# Patient Record
Sex: Female | Born: 1940 | Race: White | Hispanic: No | Smoking: Current every day smoker
Health system: Southern US, Community
[De-identification: ages and names within clinical notes are randomized; demographics above are authoritative.]

## PROBLEM LIST (undated history)

## (undated) DIAGNOSIS — M1990 Unspecified osteoarthritis, unspecified site: Secondary | ICD-10-CM

## (undated) DIAGNOSIS — E079 Disorder of thyroid, unspecified: Secondary | ICD-10-CM

## (undated) DIAGNOSIS — K219 Gastro-esophageal reflux disease without esophagitis: Secondary | ICD-10-CM

## (undated) DIAGNOSIS — F419 Anxiety disorder, unspecified: Secondary | ICD-10-CM

## (undated) DIAGNOSIS — R569 Unspecified convulsions: Secondary | ICD-10-CM

## (undated) DIAGNOSIS — I10 Essential (primary) hypertension: Secondary | ICD-10-CM

---

## 2002-11-25 ENCOUNTER — Emergency Department (HOSPITAL_COMMUNITY): Admission: EM | Admit: 2002-11-25 | Discharge: 2002-11-25 | Payer: Self-pay | Admitting: Internal Medicine

## 2010-03-25 ENCOUNTER — Emergency Department (HOSPITAL_COMMUNITY)
Admission: EM | Admit: 2010-03-25 | Discharge: 2010-03-25 | Disposition: A | Discharge status: Home / Self Care | Payer: Medicare Other | Attending: Emergency Medicine | Admitting: Emergency Medicine

## 2010-03-25 DIAGNOSIS — G8929 Other chronic pain: Secondary | ICD-10-CM | POA: Insufficient documentation

## 2010-03-25 DIAGNOSIS — M069 Rheumatoid arthritis, unspecified: Secondary | ICD-10-CM | POA: Insufficient documentation

## 2010-03-25 DIAGNOSIS — IMO0002 Reserved for concepts with insufficient information to code with codable children: Secondary | ICD-10-CM | POA: Insufficient documentation

## 2010-03-25 DIAGNOSIS — M545 Low back pain, unspecified: Secondary | ICD-10-CM | POA: Insufficient documentation

## 2010-03-25 DIAGNOSIS — F341 Dysthymic disorder: Secondary | ICD-10-CM | POA: Insufficient documentation

## 2010-03-25 DIAGNOSIS — Z79899 Other long term (current) drug therapy: Secondary | ICD-10-CM | POA: Insufficient documentation

## 2010-03-25 DIAGNOSIS — M79609 Pain in unspecified limb: Secondary | ICD-10-CM | POA: Insufficient documentation

## 2011-01-22 ENCOUNTER — Emergency Department: Payer: Self-pay | Admitting: Emergency Medicine

## 2011-05-21 ENCOUNTER — Inpatient Hospital Stay (HOSPITAL_COMMUNITY)
Admission: EM | Admit: 2011-05-21 | Discharge: 2011-05-29 | DRG: 947 | Disposition: A | Discharge status: Skilled Nursing Facility | Payer: Medicare Other | Source: Ambulatory Visit | Attending: Family Medicine | Admitting: Family Medicine

## 2011-05-21 ENCOUNTER — Emergency Department (HOSPITAL_COMMUNITY): Payer: Medicare Other

## 2011-05-21 ENCOUNTER — Encounter (HOSPITAL_COMMUNITY): Payer: Self-pay | Admitting: *Deleted

## 2011-05-21 DIAGNOSIS — F411 Generalized anxiety disorder: Secondary | ICD-10-CM | POA: Diagnosis present

## 2011-05-21 DIAGNOSIS — E785 Hyperlipidemia, unspecified: Secondary | ICD-10-CM | POA: Diagnosis present

## 2011-05-21 DIAGNOSIS — M549 Dorsalgia, unspecified: Secondary | ICD-10-CM | POA: Diagnosis present

## 2011-05-21 DIAGNOSIS — G40109 Localization-related (focal) (partial) symptomatic epilepsy and epileptic syndromes with simple partial seizures, not intractable, without status epilepticus: Secondary | ICD-10-CM | POA: Diagnosis present

## 2011-05-21 DIAGNOSIS — G929 Unspecified toxic encephalopathy: Secondary | ICD-10-CM | POA: Diagnosis present

## 2011-05-21 DIAGNOSIS — I10 Essential (primary) hypertension: Secondary | ICD-10-CM | POA: Diagnosis present

## 2011-05-21 DIAGNOSIS — E871 Hypo-osmolality and hyponatremia: Secondary | ICD-10-CM | POA: Diagnosis present

## 2011-05-21 DIAGNOSIS — E876 Hypokalemia: Secondary | ICD-10-CM

## 2011-05-21 DIAGNOSIS — F17200 Nicotine dependence, unspecified, uncomplicated: Secondary | ICD-10-CM | POA: Diagnosis present

## 2011-05-21 DIAGNOSIS — R5381 Other malaise: Secondary | ICD-10-CM | POA: Diagnosis not present

## 2011-05-21 DIAGNOSIS — F429 Obsessive-compulsive disorder, unspecified: Secondary | ICD-10-CM

## 2011-05-21 DIAGNOSIS — M810 Age-related osteoporosis without current pathological fracture: Secondary | ICD-10-CM | POA: Diagnosis present

## 2011-05-21 DIAGNOSIS — D72829 Elevated white blood cell count, unspecified: Secondary | ICD-10-CM | POA: Diagnosis present

## 2011-05-21 DIAGNOSIS — R569 Unspecified convulsions: Secondary | ICD-10-CM

## 2011-05-21 DIAGNOSIS — D649 Anemia, unspecified: Secondary | ICD-10-CM | POA: Diagnosis present

## 2011-05-21 DIAGNOSIS — G8929 Other chronic pain: Secondary | ICD-10-CM | POA: Diagnosis present

## 2011-05-21 DIAGNOSIS — K219 Gastro-esophageal reflux disease without esophagitis: Secondary | ICD-10-CM | POA: Diagnosis present

## 2011-05-21 DIAGNOSIS — G92 Toxic encephalopathy: Secondary | ICD-10-CM | POA: Diagnosis present

## 2011-05-21 DIAGNOSIS — M129 Arthropathy, unspecified: Secondary | ICD-10-CM | POA: Diagnosis present

## 2011-05-21 DIAGNOSIS — E039 Hypothyroidism, unspecified: Secondary | ICD-10-CM

## 2011-05-21 DIAGNOSIS — R4182 Altered mental status, unspecified: Principal | ICD-10-CM

## 2011-05-21 DIAGNOSIS — Z79899 Other long term (current) drug therapy: Secondary | ICD-10-CM

## 2011-05-21 HISTORY — DX: Disorder of thyroid, unspecified: E07.9

## 2011-05-21 HISTORY — DX: Essential (primary) hypertension: I10

## 2011-05-21 HISTORY — DX: Gastro-esophageal reflux disease without esophagitis: K21.9

## 2011-05-21 HISTORY — DX: Anxiety disorder, unspecified: F41.9

## 2011-05-21 HISTORY — DX: Unspecified osteoarthritis, unspecified site: M19.90

## 2011-05-21 HISTORY — DX: Unspecified convulsions: R56.9

## 2011-05-21 LAB — CBC
HCT: 32.5 % — ABNORMAL LOW (ref 36.0–46.0)
Hemoglobin: 11.1 g/dL — ABNORMAL LOW (ref 12.0–15.0)
MCH: 31.7 pg (ref 26.0–34.0)
RBC: 3.5 MIL/uL — ABNORMAL LOW (ref 3.87–5.11)

## 2011-05-21 LAB — DIFFERENTIAL
Eosinophils Absolute: 0.1 10*3/uL (ref 0.0–0.7)
Lymphs Abs: 2.3 10*3/uL (ref 0.7–4.0)
Monocytes Absolute: 0.7 10*3/uL (ref 0.1–1.0)
Monocytes Relative: 6 % (ref 3–12)
Neutro Abs: 8.7 10*3/uL — ABNORMAL HIGH (ref 1.7–7.7)
Neutrophils Relative %: 73 % (ref 43–77)

## 2011-05-21 LAB — COMPREHENSIVE METABOLIC PANEL
Alkaline Phosphatase: 118 U/L — ABNORMAL HIGH (ref 39–117)
BUN: 12 mg/dL (ref 6–23)
Chloride: 91 mEq/L — ABNORMAL LOW (ref 96–112)
GFR calc Af Amer: >90 mL/min (ref >90)
Glucose, Bld: 107 mg/dL — ABNORMAL HIGH (ref 70–99)
Potassium: 2.9 mEq/L — ABNORMAL LOW (ref 3.5–5.1)
Total Bilirubin: 0.2 mg/dL — ABNORMAL LOW (ref 0.3–1.2)

## 2011-05-21 MED ORDER — POTASSIUM CHLORIDE CRYS ER 20 MEQ PO TBCR
40.0000 meq | EXTENDED_RELEASE_TABLET | Freq: Once | ORAL | Status: AC
  Administered 2011-05-21: 40 meq via ORAL
  Filled 2011-05-21: qty 2

## 2011-05-21 MED ORDER — SODIUM CHLORIDE 0.9 % IV BOLUS (SEPSIS)
500.0000 mL | Freq: Once | INTRAVENOUS | Status: AC
  Administered 2011-05-21: 1000 mL via INTRAVENOUS

## 2011-05-21 MED ORDER — SODIUM CHLORIDE 0.9 % IV SOLN
Freq: Once | INTRAVENOUS | Status: AC
  Administered 2011-05-22: 04:00:00 via INTRAVENOUS

## 2011-05-21 NOTE — ED Notes (Signed)
Pt to ED with her sister who reports pt has been confused for past 3 days.  St's pt has been repetitive  And also has noticed other abnormal behavior.

## 2011-05-21 NOTE — ED Notes (Signed)
Th pt has had some confusion and disorientation since yesterday.  She has this intermtitently.  Worse today

## 2011-05-21 NOTE — ED Notes (Signed)
The pt cannot void at present 

## 2011-05-21 NOTE — ED Provider Notes (Signed)
History     CSN: [id number]  Arrival date & time 05/21/11  0272   First MD Initiated Contact with Patient 05/21/11 2141      Chief Complaint  Patient presents with  . confusion     Patient is a 71 y.o. female presenting with altered mental status. The history is provided by the patient and a relative. No language interpreter was used.  Altered Mental Status This is a new problem. The current episode started in the past 7 days. The problem occurs intermittently. The problem has been unchanged. Pertinent negatives include no abdominal pain, anorexia, change in bowel habit, chest pain, chills, congestion, coughing, diaphoresis, fever, headaches, joint swelling, nausea, neck pain, numbness, rash, sore throat, vertigo, visual change, vomiting or weakness. The symptoms are aggravated by nothing. She has tried nothing for the symptoms. The treatment provided no relief.    Past Medical History  Diagnosis Date  . Hypertension   . Thyroid disease   . Seizures   . Arthritis   . GERD (gastroesophageal reflux disease)   . Anxiety     History reviewed. No pertinent past surgical history.  No family history on file.  History  Substance Use Topics  . Smoking status: Current Everyday Smoker  . Smokeless tobacco: Not on file  . Alcohol Use: No    OB History    Grav Para Term Preterm Abortions TAB SAB Ect Mult Living                  Review of Systems  Constitutional: Negative for fever, chills and diaphoresis.  HENT: Negative for congestion, sore throat, trouble swallowing, neck pain and neck stiffness.   Eyes: Negative for photophobia, pain, discharge, redness, itching and visual disturbance.  Respiratory: Negative for cough, chest tightness, shortness of breath, wheezing and stridor.   Cardiovascular: Negative for chest pain, palpitations and leg swelling.  Gastrointestinal: Negative for nausea, vomiting, abdominal pain, diarrhea, constipation, blood in stool, anorexia and change  in bowel habit.  Genitourinary: Negative for dysuria, urgency, frequency, hematuria, flank pain, decreased urine volume, difficulty urinating and pelvic pain.  Musculoskeletal: Negative for back pain, joint swelling and gait problem.  Skin: Negative for rash and wound.  Neurological: Negative for dizziness, vertigo, tremors, seizures, syncope, facial asymmetry, speech difficulty, weakness, light-headedness, numbness and headaches.  Hematological: Negative for adenopathy. Does not bruise/bleed easily.  Psychiatric/Behavioral: Positive for confusion and altered mental status. Negative for decreased concentration.    Allergies  Review of patient's allergies indicates no known allergies.  Home Medications   Current Outpatient Rx  Name Route Sig Dispense Refill  . ALPRAZOLAM 1 MG PO TABS Oral Take 0.5-1 mg by mouth 3 (three) times daily as needed. For anxiety    . HEMATINIC PLUS VIT/MINERALS PO Oral Take 1 tablet by mouth daily.    Marland Kitchen DIVALPROEX SODIUM 500 MG PO TBEC Oral Take 500 mg by mouth daily.    Marland Kitchen HYDROCHLOROTHIAZIDE 12.5 MG PO CAPS Oral Take 12.5 mg by mouth daily.    Marland Kitchen HYDROXYCHLOROQUINE SULFATE 200 MG PO TABS Oral Take 400 mg by mouth daily.    Marland Kitchen LEVOTHYROXINE SODIUM 25 MCG PO TABS Oral Take 25 mcg by mouth daily.    Marland Kitchen METOPROLOL TARTRATE 50 MG PO TABS Oral Take 50 mg by mouth daily.    Marland Kitchen MIRTAZAPINE 15 MG PO TABS Oral Take 15 mg by mouth at bedtime.    . OXYCODONE-ACETAMINOPHEN 5-325 MG PO TABS Oral Take 1 tablet by mouth every 6 (  six) hours as needed. For pain    . SERTRALINE HCL 50 MG PO TABS Oral Take 50 mg by mouth daily.    Marland Kitchen VITAMIN B-12 500 MCG PO TABS Oral Take 500 mcg by mouth daily.      BP 161/73  Pulse 76  Temp(Src) 97 F (36.1 C) (Rectal)  Resp 18  SpO2 97%  Physical Exam  Constitutional: She is oriented to person, place, and time. She appears well-developed and well-nourished. No distress.  HENT:  Head: Normocephalic and atraumatic.  Mouth/Throat: No  oropharyngeal exudate.  Eyes: Conjunctivae and EOM are normal. Pupils are equal, round, and reactive to light. Right eye exhibits no discharge. Left eye exhibits no discharge. No scleral icterus.  Neck: Normal range of motion. Neck supple. No JVD present.  Cardiovascular: Normal rate, regular rhythm, normal heart sounds and intact distal pulses.   No murmur heard. Pulmonary/Chest: Effort normal and breath sounds normal. No stridor. No respiratory distress. She has no wheezes. She has no rales. She exhibits no tenderness.  Abdominal: Soft. Bowel sounds are normal. She exhibits no distension and no mass. There is no tenderness. There is no rebound and no guarding.  Musculoskeletal: Normal range of motion. She exhibits no edema and no tenderness.  Neurological: She is alert and oriented to person, place, and time. She has normal strength. Coordination normal. GCS eye subscore is 4. GCS verbal subscore is 5. GCS motor subscore is 6.       Pt intermittently followed commands with CN testing. Pt unable to state and sensory abnormalities.   Skin: Skin is warm and dry. She is not diaphoretic.  Psychiatric: She has a normal mood and affect.    ED Course  Procedures (including critical care time)  Labs Reviewed  CBC - Abnormal; Notable for the following:    WBC 11.9 (*)    RBC 3.50 (*)    Hemoglobin 11.1 (*)    HCT 32.5 (*)    Platelets 528 (*)    All other components within normal limits  DIFFERENTIAL - Abnormal; Notable for the following:    Neutro Abs 8.7 (*)    All other components within normal limits  COMPREHENSIVE METABOLIC PANEL - Abnormal; Notable for the following:    Sodium 131 (*)    Potassium 2.9 (*)    Chloride 91 (*)    Glucose, Bld 107 (*)    Albumin 3.0 (*)    Alkaline Phosphatase 118 (*)    Total Bilirubin 0.2 (*)    GFR calc non Af Amer 86 (*)    All other components within normal limits  URINALYSIS, ROUTINE W REFLEX MICROSCOPIC  ETHANOL  AMMONIA  URINALYSIS, ROUTINE  W REFLEX MICROSCOPIC  URINE CULTURE  URINE RAPID DRUG SCREEN (HOSP PERFORMED)  VALPROIC ACID LEVEL  TSH  T4, FREE   Ct Head Wo Contrast  05/21/2011  *RADIOLOGY REPORT*  Clinical Data: Confusion and disorientation since yesterday. Symptoms are worse today.  CT HEAD WITHOUT CONTRAST  Technique:  Contiguous axial images were obtained from the base of the skull through the vertex without contrast.  Comparison: None  Findings:  There is mild central cortical atrophy.  Periventricular white matter changes are consistent with small vessel disease. There is no evidence for hemorrhage, mass lesion, or acute infarction.  Bone windows show atherosclerotic calcification of the internal carotid arteries and are otherwise unremarkable.  IMPRESSION: 1.  Mild atrophy and small vessel disease.  2. No evidence for acute intracranial abnormality. Marland Kitchen  Original Report Authenticated By: Patterson Hammersmith, M.D.     1. Altered mental status    MDM  Pt is a 71yo F with PMH of seizures brought by sister for abnormal behavior over the past week including walking outside without supervision into the street and repeatative behaviors like kissing the sister on the cheek over and over. Pt A&Ox3 in NAD with stable vital signs but intermittently cooperative on exam. Pt would have periods of complete lucency followed by periods of stuttering incomprehensible speech in room which sister states pt has been doing for months and has not increased in frequency. Pt admitted to [facility] last week for seizures. Records request completed but no documents faxed back. K+ repleted. Head CT negative. Labs largely unremarkable. Infectious etiology doubtful but UA pending. Rectal temp WNL. EKG shows no signs of ischemia. Loading dose depakote given IV. Of note pt was found to have sisters robaxin and flexeril in coat pocket with unknown number of pills gone. I anticipate this could be contributing to patients symptoms. Clinical picture  doubtful for AMS caused by ETOH, hypertensive or hepatic encephalopathy, hypoglycemia, opiate OD, uremia, trauma, tumor, thyroid storm (studies pending), infection, seizure or stroke. Possibly a psychiatric component. Admitted to hospitalist in stable condition.         Consuello Masse, MD 05/22/11 [phone]

## 2011-05-22 ENCOUNTER — Inpatient Hospital Stay (HOSPITAL_COMMUNITY): Payer: Medicare Other

## 2011-05-22 ENCOUNTER — Encounter (HOSPITAL_COMMUNITY): Payer: Self-pay | Admitting: Internal Medicine

## 2011-05-22 DIAGNOSIS — R569 Unspecified convulsions: Secondary | ICD-10-CM | POA: Diagnosis present

## 2011-05-22 DIAGNOSIS — R4182 Altered mental status, unspecified: Principal | ICD-10-CM | POA: Diagnosis present

## 2011-05-22 DIAGNOSIS — F411 Generalized anxiety disorder: Secondary | ICD-10-CM

## 2011-05-22 DIAGNOSIS — E876 Hypokalemia: Secondary | ICD-10-CM | POA: Diagnosis present

## 2011-05-22 DIAGNOSIS — F429 Obsessive-compulsive disorder, unspecified: Secondary | ICD-10-CM

## 2011-05-22 DIAGNOSIS — M7989 Other specified soft tissue disorders: Secondary | ICD-10-CM

## 2011-05-22 DIAGNOSIS — E039 Hypothyroidism, unspecified: Secondary | ICD-10-CM

## 2011-05-22 LAB — URINALYSIS, ROUTINE W REFLEX MICROSCOPIC
Glucose, UA: NEGATIVE mg/dL
Ketones, ur: NEGATIVE mg/dL
Protein, ur: NEGATIVE mg/dL

## 2011-05-22 LAB — CBC
HCT: 35.3 % — ABNORMAL LOW (ref 36.0–46.0)
MCHC: 33.4 g/dL (ref 30.0–36.0)
Platelets: 541 10*3/uL — ABNORMAL HIGH (ref 150–400)
RDW: 14 % (ref 11.5–15.5)
WBC: 10.1 10*3/uL (ref 4.0–10.5)

## 2011-05-22 LAB — BASIC METABOLIC PANEL
BUN: 6 mg/dL (ref 6–23)
Chloride: 96 mEq/L (ref 96–112)
GFR calc Af Amer: >90 mL/min (ref >90)
GFR calc non Af Amer: >90 mL/min (ref >90)
Potassium: 3.1 mEq/L — ABNORMAL LOW (ref 3.5–5.1)

## 2011-05-22 LAB — RAPID URINE DRUG SCREEN, HOSP PERFORMED
Barbiturates: NOT DETECTED
Cocaine: NOT DETECTED

## 2011-05-22 LAB — URINE MICROSCOPIC-ADD ON

## 2011-05-22 LAB — URINE CULTURE
Colony Count: NO GROWTH
Culture: NO GROWTH

## 2011-05-22 LAB — VALPROIC ACID LEVEL: Valproic Acid Lvl: 67.4 ug/mL (ref 50.0–100.0)

## 2011-05-22 LAB — RPR: RPR Ser Ql: NONREACTIVE

## 2011-05-22 LAB — TSH: TSH: 1.94 u[IU]/mL (ref 0.350–4.500)

## 2011-05-22 LAB — T4, FREE: Free T4: 1 ng/dL (ref 0.80–1.80)

## 2011-05-22 MED ORDER — HYDRALAZINE HCL 20 MG/ML IJ SOLN
10.0000 mg | Freq: Four times a day (QID) | INTRAMUSCULAR | Status: DC | PRN
  Filled 2011-05-22 (×2): qty 0.5

## 2011-05-22 MED ORDER — HYDROCHLOROTHIAZIDE 12.5 MG PO CAPS
12.5000 mg | ORAL_CAPSULE | Freq: Every day | ORAL | Status: DC
  Administered 2011-05-22 – 2011-05-23 (×2): 12.5 mg via ORAL
  Filled 2011-05-22 (×2): qty 1

## 2011-05-22 MED ORDER — POTASSIUM CHLORIDE 10 MEQ/100ML IV SOLN
10.0000 meq | INTRAVENOUS | Status: AC
  Administered 2011-05-22: 10 meq via INTRAVENOUS
  Filled 2011-05-22: qty 100

## 2011-05-22 MED ORDER — LORAZEPAM 2 MG/ML IJ SOLN
INTRAMUSCULAR | Status: AC
  Filled 2011-05-22: qty 1

## 2011-05-22 MED ORDER — MIRTAZAPINE 15 MG PO TABS
15.0000 mg | ORAL_TABLET | Freq: Every day | ORAL | Status: DC
  Filled 2011-05-22: qty 1

## 2011-05-22 MED ORDER — ACETAMINOPHEN 325 MG PO TABS
650.0000 mg | ORAL_TABLET | Freq: Four times a day (QID) | ORAL | Status: DC | PRN
  Administered 2011-05-25 (×2): 650 mg via ORAL
  Filled 2011-05-22 (×3): qty 2

## 2011-05-22 MED ORDER — HYDROXYCHLOROQUINE SULFATE 200 MG PO TABS
400.0000 mg | ORAL_TABLET | Freq: Every day | ORAL | Status: DC
  Administered 2011-05-22 – 2011-05-29 (×8): 400 mg via ORAL
  Filled 2011-05-22 (×9): qty 2

## 2011-05-22 MED ORDER — VALPROATE SODIUM 500 MG/5ML IV SOLN
500.0000 mg | Freq: Once | INTRAVENOUS | Status: DC
  Filled 2011-05-22: qty 5

## 2011-05-22 MED ORDER — OXYCODONE-ACETAMINOPHEN 5-325 MG PO TABS: 1.0000 | ORAL_TABLET | Freq: Four times a day (QID) | ORAL | Status: DC | PRN

## 2011-05-22 MED ORDER — ENOXAPARIN SODIUM 40 MG/0.4ML ~~LOC~~ SOLN
40.0000 mg | SUBCUTANEOUS | Status: DC
  Administered 2011-05-23 – 2011-05-28 (×7): 40 mg via SUBCUTANEOUS
  Filled 2011-05-22 (×9): qty 0.4

## 2011-05-22 MED ORDER — ONDANSETRON HCL 4 MG PO TABS: 4.0000 mg | ORAL_TABLET | Freq: Four times a day (QID) | ORAL | Status: DC | PRN

## 2011-05-22 MED ORDER — METOPROLOL TARTRATE 50 MG PO TABS
50.0000 mg | ORAL_TABLET | Freq: Every day | ORAL | Status: DC
  Administered 2011-05-22 – 2011-05-23 (×2): 50 mg via ORAL
  Filled 2011-05-22 (×2): qty 1

## 2011-05-22 MED ORDER — DIVALPROEX SODIUM 500 MG PO DR TAB
500.0000 mg | DELAYED_RELEASE_TABLET | Freq: Every day | ORAL | Status: DC
  Administered 2011-05-22: 500 mg via ORAL
  Filled 2011-05-22: qty 1

## 2011-05-22 MED ORDER — POTASSIUM CHLORIDE CRYS ER 20 MEQ PO TBCR
40.0000 meq | EXTENDED_RELEASE_TABLET | Freq: Once | ORAL | Status: AC
  Administered 2011-05-22: 40 meq via ORAL
  Filled 2011-05-22: qty 2

## 2011-05-22 MED ORDER — FLUVOXAMINE MALEATE 50 MG PO TABS
25.0000 mg | ORAL_TABLET | Freq: Two times a day (BID) | ORAL | Status: DC
  Administered 2011-05-22 – 2011-05-26 (×8): 25 mg via ORAL
  Filled 2011-05-22 (×10): qty 1

## 2011-05-22 MED ORDER — LEVOTHYROXINE SODIUM 25 MCG PO TABS
25.0000 ug | ORAL_TABLET | Freq: Every day | ORAL | Status: DC
  Administered 2011-05-22 – 2011-05-29 (×8): 25 ug via ORAL
  Filled 2011-05-22 (×12): qty 1

## 2011-05-22 MED ORDER — ONDANSETRON HCL 4 MG/2ML IJ SOLN: 4.0000 mg | Freq: Four times a day (QID) | INTRAMUSCULAR | Status: DC | PRN

## 2011-05-22 MED ORDER — SERTRALINE HCL 50 MG PO TABS
50.0000 mg | ORAL_TABLET | Freq: Every day | ORAL | Status: DC
  Administered 2011-05-22: 50 mg via ORAL
  Filled 2011-05-22: qty 1

## 2011-05-22 MED ORDER — VALPROATE SODIUM 500 MG/5ML IV SOLN
500.0000 mg | Freq: Once | INTRAVENOUS | Status: AC
  Administered 2011-05-22: 500 mg via INTRAVENOUS
  Filled 2011-05-22: qty 5

## 2011-05-22 MED ORDER — OXYCODONE-ACETAMINOPHEN 5-325 MG PO TABS
1.0000 | ORAL_TABLET | Freq: Four times a day (QID) | ORAL | Status: DC | PRN
  Administered 2011-05-23 – 2011-05-29 (×6): 1 via ORAL
  Filled 2011-05-22 (×7): qty 1

## 2011-05-22 MED ORDER — CYANOCOBALAMIN 250 MCG PO TABS
500.0000 ug | ORAL_TABLET | Freq: Every day | ORAL | Status: DC
  Administered 2011-05-22 – 2011-05-26 (×5): 500 ug via ORAL
  Administered 2011-05-27: 11:00:00 via ORAL
  Administered 2011-05-28 – 2011-05-29 (×2): 500 ug via ORAL
  Filled 2011-05-22 (×9): qty 2

## 2011-05-22 MED ORDER — MIRTAZAPINE 15 MG PO TABS
15.0000 mg | ORAL_TABLET | Freq: Every day | ORAL | Status: DC
  Administered 2011-05-23 – 2011-05-28 (×6): 15 mg via ORAL
  Filled 2011-05-22 (×8): qty 1

## 2011-05-22 MED ORDER — ALPRAZOLAM 0.5 MG PO TABS: 0.5000 mg | ORAL_TABLET | Freq: Three times a day (TID) | ORAL | Status: DC | PRN

## 2011-05-22 MED ORDER — LORAZEPAM 2 MG/ML IJ SOLN: 1.0000 mg | INTRAMUSCULAR | Status: DC | PRN

## 2011-05-22 MED ORDER — DIVALPROEX SODIUM 500 MG PO DR TAB
750.0000 mg | DELAYED_RELEASE_TABLET | Freq: Every day | ORAL | Status: DC
  Administered 2011-05-23 – 2011-05-29 (×7): 750 mg via ORAL
  Filled 2011-05-22 (×7): qty 1

## 2011-05-22 MED ORDER — LORAZEPAM 2 MG/ML IJ SOLN
1.0000 mg | INTRAMUSCULAR | Status: AC
  Administered 2011-05-22: 1 mg via INTRAVENOUS

## 2011-05-22 NOTE — ED Provider Notes (Addendum)
  I performed a history and physical examination of Amber Pierce and discussed her management with Dr. March Rummage.  I agree with the history, physical, assessment, and plan of care, with the following exceptions: None  New onset altered metal status. This occurred in the past few days. She lives at home with her sister. She has been performing numerous bizarre behaviors such as repetitive folding of a blanket and chasing her sister around the house using her. She has a history of partial complex seizures. Her medications were minister by her sister. Has had similar episodes attributed to seizures in the past. Patient is confused on examination. She mumbles. Heart is regular rate and rhythm. Lungs clear auscultation bilaterally. CT is negative. She is hypokalemic. She'll require further evaluation as an inpatient   Date: 05/22/2011  Rate: 77  Rhythm: normal sinus rhythm and premature atrial contractions (PAC)  QRS Axis: normal  Intervals: normal  ST/T Wave abnormalities: normal  Conduction Disutrbances:none  Narrative Interpretation:   Old EKG Reviewed: none available  Tildon Husky, MD 05/22/11 0037  Dayton Bailiff, MD 05/22/11 [phone]

## 2011-05-22 NOTE — ED Notes (Signed)
Pt resting at this time.  C/o back pain and headache.

## 2011-05-22 NOTE — Progress Notes (Signed)
Overnight event:  RN called this NP 2/2 pt seizing. NP immediately to bedside.  S: Family says pt has had "seizures" all day. RN says last seizure was at 1500. Pt can not participate in ROS 2/2 mental status. RN says pt had tremors of head and upper extremities with urinary incontinence and biting of tongue.  O: Afebrile. SBP up to 220 but came down quickly to 162 without meds.  RR 20 and unlabored. Her O2 sat dropped slightly with seizure on 2L  so O2 rate up to 4L with normal SaO2 after that. Pt is unresponsive at present to name calling and tactile stimulation. PERRL at 7mm. Cardiac exam S1S2 with tachycardia. Skin is clammy.  While in room evaluating pt, she had another seizure, followed by 2 more all resembling the first one.  A/P: 1. Seizure disorder-when pt came in her valproic acid level was slightly low, so dose of IV Depakote given and Depakote ER started. Her sister says she has not missed a dose of med for at least 7 days b/c the sister was administering her meds. This NP called the Neuro Hospitalist on call. Will get stat VA level and if low, give more IV Depakote. Also, adjust a.m. Dose of Depakote. Gave Ativan 1mg  after 2nd seizure started (we had to get IV access) and pt did not have another seizure after the 4th one. This NP reviewed chart from today. EEG was normal per Dr. Lyman Speller and a psych eval was also done.  Transfer pt to step down unit for closer monitoring. Will use Ativan as needed. Will call neuro back if pt conts to seize and MD said he would see pt tonight. Will discuss case with Dr. Conley Rolls.  Maren Reamer, NP [facility]

## 2011-05-22 NOTE — Progress Notes (Signed)
Utilization review completed.  

## 2011-05-22 NOTE — Progress Notes (Signed)
Patient ID: Marlan Palau, female   DOB: [birth year], 71 y.o.   MRN: [id number] PATIENT DETAILS Name: Amber Pierce Age: 71 y.o. Sex: female Date of Birth: May 29, 1940 Admit Date: 05/21/2011 PCP:No primary provider on file.   Subjective: Patient reports back pain.  Requesting pain medication.  Objective: Weight change:   Intake/Output Summary (Last 24 hours) at 05/22/11 1204 Last data filed at 05/22/11 0926  Gross per 24 hour  Intake    360 ml  Output   2950 ml  Net  -2590 ml   Blood pressure 183/77, pulse 70, temperature 98.5 F (36.9 C), temperature source Oral, resp. rate 18, height 5\' 1"  (1.549 m), weight 45.5 kg (100 lb 5 oz), SpO2 98.00%. Filed Vitals:   05/21/11 2321 05/22/11 0035 05/22/11 0101 05/22/11 0408  BP:  170/83 182/77 183/77  Pulse:  80 76 70  Temp: 97 F (36.1 C)  98.4 F (36.9 C) 98.5 F (36.9 C)  TempSrc: Rectal  Oral Oral  Resp:  18 18 18   Height:    5\' 1"  (1.549 m)  Weight:    45.5 kg (100 lb 5 oz)  SpO2:  99% 99% 98%    Physical Exam: General: No acute distress.  Alert and orientated. Lungs: Clear to auscultation bilaterally without wheezes or crackles Cardiovascular: Regular rate and rhythm without murmur gallop or rub normal S1 and S2 Abdomen: Nontender, nondistended, soft, bowel sounds positive, no rebound, no ascites, no appreciable mass Extremities: No significant cyanosis, clubbing, or edema bilateral lower extremities Psych:  Cooperative, breaks into tears with fear that her sister is tired of caring for her. Has some difficulty getting out phrases - but is always successful eventually.  Basic Metabolic Panel:  Lab 05/22/11 4696 05/21/11 1917  NA 133* 131*  K 3.1* 2.9*  CL 96 91*  CO2 25 25  GLUCOSE 87 107*  BUN 6 12  CREATININE 0.41* 0.70  CALCIUM 9.0 9.2  MG -- --  PHOS -- --   Liver Function Tests:  Lab 05/21/11 1917  AST 31  ALT 20  ALKPHOS 118*  BILITOT 0.2*  PROT 6.9  ALBUMIN 3.0*    Lab 05/21/11 2302    AMMONIA 41   CBC:  Lab 05/22/11 0627 05/21/11 1917  WBC 10.1 11.9*  NEUTROABS -- 8.7*  HGB 11.8* 11.1*  HCT 35.3* 32.5*  MCV 92.4 92.9  PLT 541* 528*   Thyroid Function Tests:  Lab 05/21/11 2301  TSH 1.571  T4TOTAL --  FREET4 1.00  T3FREE --  THYROIDAB --   Urine Drug Screen: Drugs of Abuse     Component Value Date/Time   LABOPIA NONE DETECTED 05/21/2011 2322   COCAINSCRNUR NONE DETECTED 05/21/2011 2322   LABBENZ NONE DETECTED 05/21/2011 2322   AMPHETMU NONE DETECTED 05/21/2011 2322   THCU NONE DETECTED 05/21/2011 2322   LABBARB NONE DETECTED 05/21/2011 2322    Alcohol Level:  Lab 05/21/11 2301  ETH <11    Studies/Results: Scheduled Meds:    . sodium chloride   Intravenous Once  . divalproex  500 mg Oral Daily  . enoxaparin  40 mg Subcutaneous Q24H  . hydroxychloroquine  400 mg Oral Daily  . levothyroxine  25 mcg Oral Q0600  . metoprolol  50 mg Oral Daily  . mirtazapine  15 mg Oral QHS  . potassium chloride  10 mEq Intravenous Q1 Hr x 2  . potassium chloride  40 mEq Oral Once  . potassium chloride  40 mEq Oral Once  .  sertraline  50 mg Oral Daily  . sodium chloride  500 mL Intravenous Once  . valproate sodium  500 mg Intravenous Once  . vitamin B-12  500 mcg Oral Daily  . DISCONTD: valproate  500 mg Intravenous Once   Continuous Infusions:  PRN Meds:.acetaminophen, ALPRAZolam, ondansetron (ZOFRAN) IV, ondansetron, oxyCODONE-acetaminophen  Anti-infectives:  Anti-infectives     Start     Dose/Rate Route Frequency Ordered Stop   05/22/11 1000   hydroxychloroquine (PLAQUENIL) tablet 400 mg        400 mg Oral Daily 05/22/11 0424            Assessment/Plan:  1.  Altered mental status -  Patient calm and at baseline today.  CT head negative. TSH wnl. B12 and RPR are pending.   Have requested psychiatry consult as the patient does not have any physical exam findings or test results that would explain change in behavior.   I believe this is an  exacerbation of her OCD/psychiatric diagnosis. cancelling MRI. Psychiatry has seen the patient and believe she has capacity to make her own decisions.  They recommend starting Luvox 25 mg BID and titrate up. Discontinue Zoloft.  Psychiatry states patient is safe to go home with her sister (primary care taker)  2.  Partial seizure - I see no evidence of seizure on exam or in history.  Depakote level is low - we are giving it.  EEG completed.  Results pending.  Patient reports her last seizure was over 1 year ago and she is compliant with her medications.  3.  Arthritis pain - back pain, stable.  Patient on plaquenil.  Takes percocet at home, will restart home dose.  4.  Hypokalemia - replete ing potassium.  Check bmet in am if still inpatient.   5.  Normocytic Anemia - will monitor, stable for outpatient work up.  6.  Hypertension - on home dose metoprolol, will add back HCTZ.  Add PRN Hydralazine.  DVT Prophylaxis - on lovenox.  Disposition - Psych Social work contacting sister to see if patient can return home.   LOS: 1 day   Stephani Police 05/22/2011, 12:04 PM [phone]  Attending -I have seen and examined this patient, I agree with the assessment and plan as outlined above.  Dr Windell Norfolk

## 2011-05-22 NOTE — ED Notes (Signed)
Contact information: H[facility] Bower [phone]                                    [name]  [phone]

## 2011-05-22 NOTE — ED Notes (Signed)
Patient pulled out IV. Tip intact. Dressing applied to site. New PIV started.

## 2011-05-22 NOTE — Progress Notes (Signed)
Clinical Social Work Department CLINICAL SOCIAL WORK PSYCHIATRY SERVICE LINE ASSESSMENT 05/22/2011  Patient:  Amber Pierce  Account:  [id number]  Admit Date:  05/21/2011  Clinical Social Worker:  Unk Lightning, LCSW  Date/Time:  05/22/2011 05:15 PM Referred by:  Physician  Date referred:  05/22/2011 Reason for Referral  Behavioral Health Issues   Presenting Symptoms/Problems (In the person's/family's own words):   "I'm just in pain and I want to go home"-per patient  "I want to make sure she is ok before she comes home"-per sister   Abuse/Neglect/Trauma History (check all that apply)  Denies history   Abuse/Neglect/Trauma Comments:   Psychiatric History (check all that apply)  Inpatient/hospitilization  Outpatient treatment   Psychiatric medications:  Zoloft   Current Mental Health Hospitalizations/Previous Mental Health History:   Patient denies any treatment. Per sister, PCP prescribes medications   Current provider:   PCP prescribes Zoloft   Place and Date:   Jan 2013   Current Medications:               . sodium chloride   Intravenous Once  . divalproex  500 mg Oral Daily  . enoxaparin  40 mg Subcutaneous Q24H  . fluvoxaMINE  25 mg Oral BID  . hydrochlorothiazide  12.5 mg Oral Daily  . hydroxychloroquine  400 mg Oral Daily  . levothyroxine  25 mcg Oral Q0600  . metoprolol  50 mg Oral Daily  . mirtazapine  15 mg Oral QHS  . potassium chloride  10 mEq Intravenous Q1 Hr x 2  . potassium chloride  40 mEq Oral Once  . potassium chloride  40 mEq Oral Once  . sodium chloride  500 mL Intravenous Once  . valproate sodium  500 mg Intravenous Once  . vitamin B-12  500 mcg Oral Daily  . DISCONTD: sertraline  50 mg Oral Daily  . DISCONTD: valproate  500 mg Intravenous Once   Previous Impatient Admission/Date/Reason:   Per sister, patient admitted herself to psych hospital about 30-40 years ago for depression. Per sister and patient no other hospitalizations    Emotional Health / Current Symptoms    Suicide/Self Harm  None reported   Suicide attempt in the past:   Patient denies any history   Other harmful behavior:   None reported   Psychotic/Dissociative Symptoms  None reported   Other Psychotic/Dissociative Symptoms:    Attention/Behavioral Symptoms  Withdrawn   Other Attention / Behavioral Symptoms:    Cognitive Impairment  Within Normal Limits   Other Cognitive Impairment:    Mood and Adjustment  Flat  DEPRESSION    Stress, Anxiety, Trauma, Any Recent Loss/Stressor  None reported   Anxiety (frequency):   Phobia (specify):   Compulsive behavior (specify):   Obsessive behavior (specify):   Patient reports none. Per RN and sister report-patient folds blanket several times. Sister reports that patient was obessed with kissing her several times before admission to hospital.   Other:   Substance Abuse/Use  Current substance use   SBIRT completed (please refer for detailed history):  N  Self-reported substance use:   Patient reports no substance abuse. Per sister reports, patient uses a high dose of hydrocodone and sister discovered 50 hydrocodone pills are missing and unsure if patient took them or grandson stole them.   Urinary Drug Screen Completed:  Y Alcohol level:    Environmental/Housing/Living Arrangement  With Family Member   Who is in the home:   Sister   Emergency contact:  Transport planner  Patient's Strengths and Goals (patient's own words):   Patient reports she has seeked MH treatment in the past. Sister is supportive.   Clinical Social Worker's Interpretive Summary:   Patient was guarded and withdrawn during assessment. Patient was upset that RN had not given her pain medications and was not engaged throughout assessment. Patient reports that she has no problems and states that she is ready to leave the hospital. Patient reports she has lived with sister for 1-2 years and will return at dc.  Patient is agreeable to CSW calling to confirm living arrangements. Patient reports no abnormal behavior and reports that she does not remember why she was admitted to the hospital. Patient states no current SI or HI. Patient states she had depression in the past but reports no hospitalizations. Sister reported that patient takes Zoloft for depression and reports hospitalizations in the past. Sister reports that patient does not have any substance abuse problem but then later reported that 50 hydrocodone pills were missing and that patient's blood levels were 3 times the normal limit for opiates per MD report at hospital a few weeks ago. Sister reports that in the past when MD has lowered patient's pain medication that patient has gotten family members to buy her pain medication off the streets. Sister reports that she now manages patient's medication and money and that patient does not have access to street drugs. Sister reports this behavior just starting occurring after pills went missing and is wondering if there is a connection between pills and patient's behavior. CSW called psychiatrist and left a message regarding patient's substance use. Sister reports that patient can return home and does not wish for patient to ever be placed in a nursing home. Sister does report concern for patient's wellbeing. CSW will continue to follow.   Disposition:  Recommend Psych CSW continuing to support while in hospital
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
## 2011-05-22 NOTE — Progress Notes (Signed)
*  PRELIMINARY RESULTS* Vascular Ultrasound Bilateral lower extremity venous duplex  has been completed.   No evidence of lower extremity deep vein thrombosis bilaterally. All veins compressible.  Malachy Moan, RDMS, RDCS 05/22/2011, 2:22 PM

## 2011-05-22 NOTE — H&P (Signed)
PCP: Aggie Cosier   Chief Complaint: Altered mental status with bizarre behaviors   HPI: Amber Pierce is an 71 y.o. 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 Her sister states she has obsessive-compulsive tendency especially about cleanliness, but this past few days, she has been picking up very small tweaks on a walkway, spending an inordinate amount of time folding her blanket over the over again, chasing her sister to give her kisses. She is alert and oriented, but appears to have flat affect. Evaluation included a negative head CT, negative urinary drug screen, valproic acid level of 47, slight elevation of white count of 11.9 thousand, potassium of 3.9, creatinine of 0.7 and serum sodium of 131. Hospitalist was asked to admit her for altered mental status workup.  Rewiew of Systems:  The patient denies anorexia, fever, weight loss,, vision loss, decreased hearing, hoarseness, chest pain, syncope, dyspnea on exertion, peripheral edema, balance deficits, hemoptysis, abdominal pain, melena, hematochezia, severe indigestion/heartburn, hematuria, incontinence, genital sores, muscle weakness, suspicious skin lesions, transient blindness, difficulty walking, depression, unusual weight change, abnormal bleeding, enlarged lymph nodes, angioedema, and breast masses.    Past Medical History  Diagnosis Date  . Hypertension   . Thyroid disease   . Seizures   . Arthritis   . GERD (gastroesophageal reflux disease)   . Anxiety     History reviewed. No pertinent past surgical history.  Medications:  HOME MEDS: Prior to Admission medications   Medication Sig Start Date End Date Taking? Authorizing Provider  ALPRAZolam Prudy Feeler) 1 MG tablet Take 0.5-1 mg by mouth 3 (three) times daily as needed. For anxiety   Yes Historical Provider, MD  B  Complex-C-Min-Fe-FA (HEMATINIC PLUS VIT/MINERALS PO) Take 1 tablet by mouth daily.   Yes Historical Provider, MD  divalproex (DEPAKOTE) 500 MG DR tablet Take 500 mg by mouth daily.   Yes Historical Provider, MD  hydrochlorothiazide (MICROZIDE) 12.5 MG capsule Take 12.5 mg by mouth daily.   Yes Historical Provider, MD  hydroxychloroquine (PLAQUENIL) 200 MG tablet Take 400 mg by mouth daily.   Yes Historical Provider, MD  levothyroxine (SYNTHROID, LEVOTHROID) 25 MCG tablet Take 25 mcg by mouth daily.   Yes Historical Provider, MD  metoprolol (LOPRESSOR) 50 MG tablet Take 50 mg by mouth daily.   Yes Historical Provider, MD  mirtazapine (REMERON) 15 MG tablet Take 15 mg by mouth at bedtime.   Yes Historical Provider, MD  oxyCODONE-acetaminophen (PERCOCET) 5-325 MG per tablet Take 1 tablet by mouth every 6 (six) hours as needed. For pain   Yes Historical Provider, MD  sertraline (ZOLOFT) 50 MG tablet Take 50 mg by mouth daily.   Yes Historical Provider, MD  vitamin B-12 (CYANOCOBALAMIN) 500 MCG tablet Take 500 mcg by mouth daily.   Yes Historical Provider, MD     Allergies:  No Known Allergies  Social History:   reports that she has been smoking.  She does not have any smokeless tobacco history on file. She reports that she does not drink alcohol. Her drug history not on file.  Family History: No family history on file.   Physical Exam: Filed Vitals:   05/21/11 2218 05/21/11 2321 05/22/11 0035 05/22/11 0101  BP: 161/73  170/83 182/77  Pulse: 76  80 76  Temp:  97 F (36.1 C)  98.4 F (36.9 C)  TempSrc:  Rectal  Oral  Resp: 18  18 18   SpO2: 97%  99% 99%   Blood pressure 182/77, pulse 76, temperature 98.4 F (36.9 C), temperature source Oral, resp. rate 18, SpO2 99.00%.  GEN:  Flat affect person lying in the stretcher in no acute distress; cooperative with exam. PSYCH:  alert and oriented x4; does not appear anxious or depressed; affect flat HEENT: Mucous membranes pink and anicteric;  PERRLA; EOM intact; no cervical lymphadenopathy nor thyromegaly or carotid bruit; no JVD; Breasts:: Not examined CHEST WALL: No tenderness CHEST: Normal respiration, clear to auscultation bilaterally HEART: Regular rate and rhythm; no murmurs rubs or gallops BACK: No kyphosis or scoliosis; no CVA tenderness ABDOMEN: Obese, soft non-tender; no masses, no organomegaly, normal abdominal bowel sounds; no pannus; no intertriginous candida. Rectal Exam: Not done EXTREMITIES: No bone or joint deformity; age-appropriate arthropathy of the hands and knees; no edema; no ulcerations. Genitalia: not examined PULSES: 2+ and symmetric SKIN: Normal hydration no rash or ulceration CNS: Cranial nerves 2-12 grossly intact no focal lateralizing neurologic deficit. She has fluent speech with facial symmetry. Uvula elevated with phonation.   Labs & Imaging Results for orders placed during the hospital encounter of 05/21/11 (from the past 48 hour(s))  CBC     Status: Abnormal   Collection Time   05/21/11  7:17 PM      Component Value Range Comment   WBC 11.9 (*) 4.0 - 10.5 (K/uL)    RBC 3.50 (*) 3.87 - 5.11 (MIL/uL)    Hemoglobin 11.1 (*) 12.0 - 15.0 (g/dL)    HCT 16.1 (*) 09.6 - 46.0 (%)    MCV 92.9  78.0 - 100.0 (fL)    MCH 31.7  26.0 - 34.0 (pg)    MCHC 34.2  30.0 - 36.0 (g/dL)    RDW 04.5  40.9 - 81.1 (%)    Platelets 528 (*) 150 - 400 (K/uL)   DIFFERENTIAL     Status: Abnormal   Collection Time   05/21/11  7:17 PM      Component Value Range Comment   Neutrophils Relative 73  43 - 77 (%)    Neutro Abs 8.7 (*) 1.7 - 7.7 (K/uL)    Lymphocytes Relative 20  12 - 46 (%)    Lymphs Abs 2.3  0.7 - 4.0 (K/uL)    Monocytes Relative 6  3 - 12 (%)    Monocytes Absolute 0.7  0.1 - 1.0 (K/uL)    Eosinophils Relative 1  0 - 5 (%)    Eosinophils Absolute 0.1  0.0 - 0.7 (K/uL)    Basophils Relative 0  0 - 1 (%)    Basophils Absolute 0.0  0.0 - 0.1 (K/uL)   COMPREHENSIVE METABOLIC PANEL     Status: Abnormal    Collection Time   05/21/11  7:17 PM      Component Value Range Comment   Sodium 131 (*) 135 - 145 (mEq/L)    Potassium 2.9 (*) 3.5 - 5.1 (mEq/L)    Chloride 91 (*) 96 - 112 (mEq/L)    CO2 25  19 - 32 (mEq/L)    Glucose, Bld 107 (*) 70 - 99 (mg/dL)    BUN 12  6 - 23 (mg/dL)    Creatinine, Ser 9.14  0.50 - 1.10 (mg/dL)    Calcium 9.2  8.4 - 10.5 (mg/dL)    Total Protein 6.9  6.0 - 8.3 (g/dL)    Albumin 3.0 (*) 3.5 - 5.2 (g/dL)    AST 31  0 - 37 (U/L)    ALT 20  0 - 35 (U/L)    Alkaline Phosphatase 118 (*) 39 - 117 (U/L)    Total Bilirubin 0.2 (*) 0.3 - 1.2 (mg/dL)    GFR calc non Af Amer 86 (*) >90 (mL/min)    GFR calc Af Amer >90  >90 (mL/min)   ETHANOL     Status: Normal   Collection Time   05/21/11 11:01 PM      Component Value Range Comment   Alcohol, Ethyl (B) <11  0 - 11 (mg/dL)   VALPROIC ACID LEVEL     Status: Abnormal   Collection Time   05/21/11 11:01 PM      Component Value Range Comment   Valproic Acid Lvl 45.7 (*) 50.0 - 100.0 (ug/mL)   AMMONIA     Status: Normal   Collection Time   05/21/11 11:02 PM      Component Value Range Comment   Ammonia 41  11 - 60 (umol/L)   URINALYSIS, ROUTINE W REFLEX MICROSCOPIC     Status: Abnormal   Collection Time   05/21/11 11:22 PM      Component Value Range Comment   Color, Urine YELLOW  YELLOW     APPearance CLEAR  CLEAR     Specific Gravity, Urine 1.010  1.005 - 1.030     pH 8.0  5.0 - 8.0     Glucose, UA NEGATIVE  NEGATIVE (mg/dL)    Hgb urine dipstick TRACE (*) NEGATIVE     Bilirubin Urine NEGATIVE  NEGATIVE     Ketones, ur NEGATIVE  NEGATIVE (mg/dL)    Protein, ur NEGATIVE  NEGATIVE (mg/dL)    Urobilinogen, UA 0.2  0.0 - 1.0 (mg/dL)    Nitrite NEGATIVE  NEGATIVE     Leukocytes, UA NEGATIVE  NEGATIVE    URINE RAPID DRUG SCREEN (HOSP PERFORMED)     Status: Normal   Collection Time   05/21/11 11:22 PM      Component Value Range Comment   Opiates NONE DETECTED  NONE DETECTED     Cocaine NONE DETECTED  NONE DETECTED      Benzodiazepines NONE DETECTED  NONE DETECTED     Amphetamines NONE DETECTED  NONE DETECTED     Tetrahydrocannabinol NONE DETECTED  NONE DETECTED     Barbiturates NONE DETECTED  NONE DETECTED    URINE MICROSCOPIC-ADD ON     Status: Normal   Collection Time   05/21/11 11:22 PM      Component Value Range Comment   Squamous Epithelial / LPF RARE  RARE     WBC, UA 0-2  <3 (WBC/hpf)    RBC / HPF 3-6  <3 (RBC/hpf)    Ct Head Wo Contrast  05/21/2011  *RADIOLOGY REPORT*  Clinical Data: Confusion and disorientation since yesterday. Symptoms are worse today.  CT HEAD WITHOUT CONTRAST  Technique:  Contiguous axial images were obtained from the base of the skull through the vertex without contrast.  Comparison: None  Findings:  There is mild central cortical atrophy.  Periventricular white matter changes are consistent with small vessel disease. There is no evidence for hemorrhage, mass lesion, or acute infarction.  Bone windows show atherosclerotic calcification of the internal carotid arteries and are otherwise unremarkable.  IMPRESSION: 1.  Mild atrophy and small vessel disease.  2. No evidence for acute intracranial abnormality. .  Original Report Authenticated By: Patterson Hammersmith, M.D.      Assessment Present on Admission:  .  Altered mental status .OCD (obsessive compulsive disorder) .Partial seizure .Hypokalemia   PLAN: This patient presents with severe obsessive compulsive disorder, behavior changes, but no psychosis. Is very suspicious that this may represent primary psychiatric disorder. She does have prior obsessive-compulsive tendency, and also has partial seizure. I will start workup for organic cause, to include thyroid studies, MRI of her brain with and without contrast, B12 level, RPR, and an EEG. Please consult psychiatry in the morning for psychiatric evaluation. Her valproic acid level is subtherapeutic and was supplemented. She also has hypokalemia and this was being supplemented as  well. She is hemodynamically stable, having no evidence of infection, and will be admitted to [facility] hospitalist service.   Other plans as per orders.    Deserea Bordley 05/22/2011, 3:05 AM

## 2011-05-22 NOTE — Consult Note (Signed)
EEG = normal awake and drowsy record. EKG = irregular.   Carmell Austria, MD

## 2011-05-23 LAB — BASIC METABOLIC PANEL
Chloride: 94 mEq/L — ABNORMAL LOW (ref 96–112)
GFR calc Af Amer: >90 mL/min (ref >90)
GFR calc non Af Amer: >90 mL/min (ref >90)
Potassium: 3.2 mEq/L — ABNORMAL LOW (ref 3.5–5.1)
Sodium: 130 mEq/L — ABNORMAL LOW (ref 135–145)

## 2011-05-23 LAB — MRSA PCR SCREENING: MRSA by PCR: POSITIVE — AB

## 2011-05-23 MED ORDER — MUPIROCIN 2 % EX OINT
1.0000 "application " | TOPICAL_OINTMENT | Freq: Two times a day (BID) | CUTANEOUS | Status: AC
  Administered 2011-05-23 – 2011-05-27 (×10): 1 via NASAL
  Filled 2011-05-23: qty 22

## 2011-05-23 MED ORDER — METOPROLOL TARTRATE 100 MG PO TABS
100.0000 mg | ORAL_TABLET | Freq: Every day | ORAL | Status: DC
  Administered 2011-05-24 – 2011-05-29 (×6): 100 mg via ORAL
  Filled 2011-05-23 (×6): qty 1

## 2011-05-23 MED ORDER — NICOTINE 14 MG/24HR TD PT24
14.0000 mg | MEDICATED_PATCH | Freq: Every day | TRANSDERMAL | Status: DC
  Administered 2011-05-23 – 2011-05-29 (×7): 14 mg via TRANSDERMAL
  Filled 2011-05-23 (×7): qty 1

## 2011-05-23 MED ORDER — CHLORHEXIDINE GLUCONATE CLOTH 2 % EX PADS
6.0000 | MEDICATED_PAD | Freq: Every day | CUTANEOUS | Status: AC
  Administered 2011-05-24 – 2011-05-27 (×4): 6 via TOPICAL

## 2011-05-23 MED ORDER — ALPRAZOLAM 0.25 MG PO TABS
0.5000 mg | ORAL_TABLET | Freq: Three times a day (TID) | ORAL | Status: DC | PRN
  Administered 2011-05-23: 0.5 mg via ORAL
  Administered 2011-05-24 – 2011-05-27 (×3): 1 mg via ORAL
  Administered 2011-05-27: 0.75 mg via ORAL
  Administered 2011-05-28: 1 mg via ORAL
  Administered 2011-05-28: 0.75 mg via ORAL
  Administered 2011-05-29: 1 mg via ORAL
  Filled 2011-05-23 (×2): qty 4
  Filled 2011-05-23: qty 3
  Filled 2011-05-23 (×2): qty 4
  Filled 2011-05-23: qty 3
  Filled 2011-05-23: qty 4
  Filled 2011-05-23: qty 1
  Filled 2011-05-23: qty 2

## 2011-05-23 MED ORDER — POTASSIUM CHLORIDE CRYS ER 20 MEQ PO TBCR
40.0000 meq | EXTENDED_RELEASE_TABLET | Freq: Three times a day (TID) | ORAL | Status: AC
  Administered 2011-05-23 – 2011-05-24 (×3): 40 meq via ORAL
  Filled 2011-05-23 (×3): qty 2

## 2011-05-23 NOTE — Progress Notes (Signed)
Clinical Social Work  CSW received a message from sister stating that she needed to talk to MD regarding patient's condition and why she was moved to step down unit. Sister requested MD phone call. CSW text paged MD sister's number. CSW called sister and left a message with CSW contact information. CSW will continue to follow.  [place], [place] [phone] (Coverage for [employer] [employer])
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
## 2011-05-23 NOTE — Progress Notes (Signed)
I have called the patient's sister 2 times today.  Each time I received a voicemail message.  On the first call I left a message to indicate that I would try again in ~20-30 mins.  I did so, and again received a voicemail message.    Lonia Blood, MD [facility] Office  [phone] Pager [phone]  On-Call/Text Page:      [email or web address]      password [facility]

## 2011-05-23 NOTE — Progress Notes (Signed)
SHIFT SUMMARY: 0745:  Received patient in bed, trying to sleep. She  is oriented to name, place [facility]), the date and her home place address.  She was totally covered with blanket from head to toe. I asked her if I can see her face while I slowly pull the blanket off her.  I fixed her blanket (several blankets [4]) as well as her gown.  And she tried to go to sleep.   I did my morning assessment and went out off the room with bed alarm on.  Apprxly. 0815:  Heard the bed alarm goes off, rushed to see pt standing at the side of the bed, all tangled with the cables, gown is off, some blankets on the floor.  She stated that she wants to smoke a cigarette at night.  Reoriented her that it is 0815 in the morning.  She repeatedly say that she just want 1 cigarette.  I placed an isolation gown on the pt and I ambulated her in the hallway twice before she went back to bed.  Around 9:00, I heard the bed alarm goes off again and rushed back to the room, patient trying to get out of bed again.  I assisted her on her feet so I can ambulate her again in the hallway but this time, pt refused to stand up.  Pt's gait is not steady, she requires close supervision.  Pt has an order for a sitter for safety and by this time, I think she needs a sitter before she hurts herself.

## 2011-05-23 NOTE — Progress Notes (Signed)
Clinical Social Work Progress Note PSYCHIATRY SERVICE LINE 05/23/2011  Patient:  Amber Pierce  Account:  [id number]  Admit Date:  05/21/2011  Clinical Social Worker:  Unk Lightning, LCSW  Date/Time:  05/23/2011 02:45 PM  Review of Patient  Overall Medical Condition:   Patient was laying in bed folding the blanket several times. Patient was moved to step down unit. Per sitter, patient has been trying to leave in order to smoke and refusing to eat.   Participation Level:  Minimal  Participation Quality  Other - See comment   Other Participation Quality:   Inattentive   Affect  Flat   Cognitive  Confused   Reaction to Medications/Concerns:   Patient reports "I forgot to take my medication" CSW reminded patient that RN would administer medication   Modes of Intervention  Confrontation  Support   Summary of Progress/Plan at Discharge   CSW met with patient at bedside. Patient reported that she wanted to leave. Patient reported that she was anxious. Per sitter, patient has been wanting to smoke all day. Patient partcipated in session but was distracted by folding her blanket. CSW spoke with patient regarding her feelings while folding the blanket and how she feels when she is unable to cope that way. Patient reports she has used this as a coping mechanism for several years. Per sitter, patient reported she would eat but sitter was unable to redirect patient to eat. Sitter and CSW worked with patient regarding eating. After redirecting patient about 3 or 4 times and removing the blanket, patient began playing with her gown. Patient finally ate some of her lunch while being redirected to talk about her sister. Patient reported at one point she did not want to return home with her sister but then after discussing plans regarding paying bills and medication, patient stated she wanted to go home with "Amber Pierce" (her sister) Patient was guarded throughout assessment and paused before  answering questions. Patient avoided eye contact throughout assessment. Patient agreeable to CSW following up tomorrow.

## 2011-05-23 NOTE — Progress Notes (Signed)
Called at 2030 to assess patient with known seizure disorder, upon arrival [facility] in room along with bedside RN. Patient had partial seizure activity in face and upper extremities, per family patient had seized earlier in the day but not to this level. Patient was clearly unresponsive, incontinent of urine and biting tongue. This episode ended and another started within 20-30 seconds. Patient's airway was maintained, IV access was established and 1mg  Ativan IV was given. O2 titrated to 5L during event, sats 99%. Vital signs stabilized in post ictal state (see doc flowsheet). Patient unable to be aroused but maintaining airway, will move to stepdown for further monitoring.

## 2011-05-23 NOTE — Progress Notes (Addendum)
[facility] [place] TEAM 1 - Stepdown/ICU TEAM  Subjective: 71 y.o. 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 Her sister states she has obsessive-compulsive tendency especially about cleanliness; spending an inordinate amount of time folding her blanket over and over again; chasing her sister compulsively to give her kisses.   The patient was approaching stability, with plans for D/C home, but then suffered an acute episode of multiple difficult to arrest seizures.  She was unconscious, and incontinent of urine.  She required transfer to the SDU for closer monitoring.  Today she has regained consciousness.  She has no memory of the events yesterday.  She is obsessively folding and re-folding the blanket on her bed.  She denies any complaints, and states she wants to go home.  She appears mildly agitated, and somewhat tremulous, but no seizure activity is noted.   Objective: Weight change:   Intake/Output Summary (Last 24 hours) at 05/23/11 1132 Last data filed at 05/22/11 1920  Gross per 24 hour  Intake    342 ml  Output      0 ml  Net    342 ml   Blood pressure 151/68, pulse 76, temperature 98.1 F (36.7 C), temperature source Axillary, resp. rate 22, height 5\' 1"  (1.549 m), weight 45.5 kg (100 lb 5 oz), SpO2 96.00%.  Physical Exam: General: No acute respiratory distress Lungs: Clear to auscultation bilaterally without wheezes or crackles Cardiovascular: Regular rate and rhythm without murmur gallop or rub  Abdomen: Nontender, nondistended, soft, bowel sounds positive, no rebound, no ascites, no appreciable mass Extremities: No significant cyanosis, clubbing, or edema bilateral lower extremities  Lab Results:  Basename 05/23/11 0355 05/22/11 0627 05/21/11 1917  NA 130* 133* 131*  K 3.2* 3.1* 2.9*  CL 94* 96 91*  CO2 25 25 25     GLUCOSE 99 87 107*  BUN 9 6 12   CREATININE 0.54 0.41* 0.70  CALCIUM 8.9 9.0 9.2  MG -- -- --  PHOS -- -- --    Basename 05/21/11 1917  AST 31  ALT 20  ALKPHOS 118*  BILITOT 0.2*  PROT 6.9  ALBUMIN 3.0*    Basename 05/22/11 0627 05/21/11 1917  WBC 10.1 11.9*  NEUTROABS -- 8.7*  HGB 11.8* 11.1*  HCT 35.3* 32.5*  MCV 92.4 92.9  PLT 541* 528*   Micro Results: Recent Results (from the past 240 hour(s))  URINE CULTURE     Status: Normal   Collection Time   05/21/11 11:22 PM      Component Value Range Status Comment   Specimen Description IN/OUT CATH URINE   Final    Special Requests NONE   Final    Culture  Setup Time [id number]   Final    Colony Count NO GROWTH   Final    Culture NO GROWTH   Final    Report Status 05/22/2011 FINAL   Final   MRSA PCR SCREENING     Status: Abnormal   Collection Time   05/22/11 11:19 PM      Component Value Range Status Comment   MRSA by PCR POSITIVE (*) NEGATIVE  Final    Studies/Results: All recent x-ray/radiology reports have been reviewed in detail.   Medications: I have reviewed the patient's complete medication list.  Assessment/Plan:  Altered mental status/Toxic Metabolic Encephalopathy - Anxiety d/o - OCD CT head negative -  TSH normal - Psych recs noted and being followed - pt cleared for capacity by Psych - though her mental status has improved today, she appears to clearly be suffering with decompensated OCD - will continue luvox for now (but have to watch Na level in setting of recent seizures)  Seizure d/o w/ acute seizure during this hospital stay EEG w/o acute findings - pt had focal seizures noted last night (twitching of face and arms, unconscious, incontinent of urine) - her depakote was not therapeutic - she has been loaded and is now within the goal range - we will monitor her overnight to assure she does not have further seizure activity - will need to follow her Na closely, as low Na could contribute to seizure  activity - if seizures recur a f/u Neuro consult will be necessary  Hypokalemia Not yet at goal - continue to replace  Hyponatremia  Slowly worsening - could be related to SSRI (was on zoloft pre-admit / now on luvox) - will need to follow trend - cont SSRI for now due to severity of OCD  HTN BP remains elevated above goal - adjust tx regimen and follow - d/c HCTZ given hyponatremia  Thyroid disease TSH at goal   Chronic back pain Now on home med regimen  Normocytic anemia Will need outpt f/u   Dispo Remain in SDU additional 24hrs - possible D/C if no further sz activity and if Na improves  Lonia Blood, MD [facility] Office  [phone] Pager [phone]  On-Call/Text Page:      [email or web address]      password [facility]

## 2011-05-23 NOTE — Progress Notes (Signed)
Patient's sister, [name] and her friend came and asked some questions regarding pt's status.  Updated and informed that patient has some confusion and pt's sister think that it might be due to the meds that she is supposed to take but has not been taking it because she think that pt's grandson might be taking her pain med but she doesn't want the pt to know.  They were also informed that pt is positive for MRSA.  I explained to the family about MRSA and the necessity of wearing the blue gown.  Both of them verbalized understanding.

## 2011-05-23 NOTE — Progress Notes (Signed)
Called RN to check on pt. No further seizures since Ativan 1mg . Pt is responsive now, but still sleepy. SaO2 is normal on RA now. VSS.  Maren Reamer, NP [facility]

## 2011-05-23 NOTE — Progress Notes (Signed)
  Clinical Social Work   CSW received a call from sister stating that she believes patient's oxycodone was switched and she does not believe patient was getting that medication. Per sister, the oxycodone had been switched with tylenol. Sister reported she was unsure what the medication was but took it to the pharmacist who said it was tylenol. Sister reports she is worried that patient is going through withdrawals from not having her pain medication. CSW text paged MD. CSW will continue to follow.   [place], [place] [phone] (Coverage for [employer])

## 2011-05-23 NOTE — Progress Notes (Signed)
NP and rapid response to bedside. Pt had tremors of the head and upper extremities with urinary incontinence. Pt is unresponsive. 1 mg Ativan IV given. Pt. Transferred to 2600.  Syanna Remmert E

## 2011-05-24 LAB — CBC
HCT: 39.4 % (ref 36.0–46.0)
Hemoglobin: 13.8 g/dL (ref 12.0–15.0)
MCHC: 35 g/dL (ref 30.0–36.0)
MCV: 92.5 fL (ref 78.0–100.0)
RDW: 13.6 % (ref 11.5–15.5)

## 2011-05-24 LAB — BASIC METABOLIC PANEL
CO2: 28 mEq/L (ref 19–32)
Calcium: 10 mg/dL (ref 8.4–10.5)
Creatinine, Ser: 0.44 mg/dL — ABNORMAL LOW (ref 0.50–1.10)
Glucose, Bld: 83 mg/dL (ref 70–99)

## 2011-05-24 MED ORDER — LORAZEPAM 2 MG/ML IJ SOLN: 0.5000 mg | Freq: Once | INTRAMUSCULAR | Status: DC | PRN

## 2011-05-24 NOTE — Clinical Documentation Improvement (Signed)
CHANGE MENTAL STATUS DOCUMENTATION CLARIFICATION   THIS DOCUMENT IS NOT A PERMANENT PART OF THE MEDICAL RECORD  TO RESPOND TO THE THIS QUERY, FOLLOW THE INSTRUCTIONS BELOW:  1. If needed, update documentation for the patient's encounter via the notes activity.  2. Access this query again and click edit on the In [employer].  3. After updating, or not, click F2 to complete all highlighted (required) fields concerning your review. Select "additional documentation in the medical record" OR "no additional documentation provided".  4. Click Sign note button.  5. The deficiency will fall out of your In Basket *Please let us know if you are not able to complete this workflow by phone or e-mail (listed below).         05/24/11  Dear Dr.  [facility]  In an effort to better capture your patient's severity of illness, reflect appropriate length of stay and utilization of resources, a review of the patient medical record has revealed the following indicators.    Based on your clinical judgment, please clarify and document in a progress note and/or discharge summary the clinical condition associated with the following supporting information:  In responding to this query please exercise your independent judgment.  The fact that a query is asked, does not imply that any particular answer is desired or expected.  Possible Clinical Conditions  __xxx_____Encephalopathy (describe type if known)                     ____xxx____Metabolic                       _______Other  _______Acute delirium  _______Other Condition  _______Cannot Clinically Determine   Supporting Information: Risk Factors: "Hyponatremia" per notes "Hypokalemia"   Per notes Hx Seizures, Chronic Pain w/meds  Signs & Symptoms: "Changes in her behavior the past 2 days" per notes "Confusion" per notes  Diagnostics: CT scan on 4/15 IMPRESSION:  1. Mild atrophy and small vessel disease.  2. No evidence  for acute intracranial abnormality  EEG: on 4/18 IMPRESSION: This was a normal awake and drowsy EEG. A normal EEG does  not rule out the clinical diagnosis of epilepsy, if clinically  warranted, a repeat extended EEG, ambulatory requiring may be obtained  for prolonged recording times and sleep capture, which may increased  diagnostic yield. Single-channel EKG monitoring detected an irregular  rhythm. If clinically warranted, clinical correlation is suggested   Treatment: Safe sitter around the clock Continuous assessment and monitoring  Reviewed: additional documentation in the medical record  Thank You,    Rossie Muskrat RN, BSN  Clinical Documentation Specialist Pager:  [phone] [email or web address]  Health Information Management [place]
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
## 2011-05-24 NOTE — Procedures (Signed)
EEG NUMBER:  [id number].  HISTORY:  A 71 years old woman who was referred for rule out seizures.  MEDICATIONS:  Depakote.  CONDITION OF RECORDING:  This 16-lead EEG was recorded with the patient in awake and drowsy states.  Background rhythms: background patterns in wakefulness were well organized with a well-sustained posterior dominant rhythm of 9 Hz, symmetrical and reactive to eye opening and closing. Drowsiness was associated with mild attenuation of voltage and slowing frequencies.  Abnormal potentials: no epileptiform activity or focal slowing was noted.  ACTIVATION PROCEDURES:  Hyperventilation was not performed.  Photic stimulation was not performed.  EKG:  Single-channel of EKG monitoring detected an irregular rhythm.  IMPRESSION:  This was a normal awake and drowsy EEG.  A normal EEG does not rule out the clinical diagnosis of epilepsy. If clinically warranted, a repeat extended EEG or ambulatory recording may be obtained for prolonged recording times and sleep capture, which may increase the diagnostic yield.  Single-channel of EKG monitoring detected an irregular rhythm.  If clinically warranted, clinical correlation is suggested.          ______________________________ Carmell Austria, MD    [id number] D:  05/22/2011 14:39:52  T:  05/22/2011 22:32:26  Job #:  [id number]
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
## 2011-05-24 NOTE — Progress Notes (Signed)
Clinical Social Work Progress Note PSYCHIATRY SERVICE LINE 05/24/2011  Patient:  Amber Pierce  Account:  [id number]  Admit Date:  05/21/2011  Clinical Social Worker:  Unk Lightning, LCSW  Date/Time:  05/24/2011 03:45 PM  Review of Patient  Overall Medical Condition:   Patient was moved to 3000 from step down unit   Participation Level:  Active  Participation Quality  Attentive   Other Participation Quality:   Affect  Flat   Cognitive  Appropriate   Reaction to Medications/Concerns:   None reported from patient. Sister is still concerned about patient's medications and wishes for patient to have inpt psych.   Modes of Intervention  Support   Summary of Progress/Plan at Discharge   Patient was alert and sitting in bed watching tv. Patient was engaged during assessment. Patient was able to report she was in the hospital and knew the date. Patient reports she feels much better today and ate lunch. Patient was not folding the blanket today and reports she only folded it this morning. Patient expressed sadness when CSW reported sister would not visit until the morning. Patient reports she wants to return home with sister. Patient was approrpriate throughout assessment and answered questions appropriately. Patient appears to be improving from obessive behavior related to blanket and appears to be functioning better. CSW spoke with sister and sister reported she wants patient to have inpt. psych. CSW explained psychiatrist was not recommending inpatient. Sister reports she would like to discuss placement options with CSW face-to-face. CSW will continue to follow.

## 2011-05-25 ENCOUNTER — Inpatient Hospital Stay (HOSPITAL_COMMUNITY): Payer: Medicare Other

## 2011-05-25 LAB — URINALYSIS, ROUTINE W REFLEX MICROSCOPIC
Ketones, ur: NEGATIVE mg/dL
Leukocytes, UA: NEGATIVE
Nitrite: NEGATIVE
Protein, ur: 30 mg/dL — AB
Urobilinogen, UA: 0.2 mg/dL (ref 0.0–1.0)

## 2011-05-25 LAB — BASIC METABOLIC PANEL
BUN: 16 mg/dL (ref 6–23)
Chloride: 96 mEq/L (ref 96–112)
Creatinine, Ser: 0.66 mg/dL (ref 0.50–1.10)
GFR calc Af Amer: >90 mL/min (ref >90)
GFR calc non Af Amer: 87 mL/min — ABNORMAL LOW (ref >90)
Glucose, Bld: 84 mg/dL (ref 70–99)

## 2011-05-25 LAB — URINE MICROSCOPIC-ADD ON

## 2011-05-25 MED ORDER — AMLODIPINE BESYLATE 5 MG PO TABS
5.0000 mg | ORAL_TABLET | Freq: Every day | ORAL | Status: DC
  Administered 2011-05-25 – 2011-05-27 (×3): 5 mg via ORAL
  Filled 2011-05-25 (×3): qty 1

## 2011-05-25 MED ORDER — AMLODIPINE 1 MG/ML ORAL SUSPENSION: 5.0000 mg | Freq: Every day | ORAL | Status: DC
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
## 2011-05-25 NOTE — Evaluation (Signed)
Physical Therapy Evaluation Patient Details Name: Amber Pierce MRN: [id number] DOB: Feb 05, 1941 Today's Date: 05/25/2011 Time: 9147-8295 PT Time Calculation (min): 37 min  PT Assessment / Plan / Recommendation Clinical Impression  Pt. is a 71 y/o female admitted for AMS and obsessive compulsive tendencies.  Pt. presenting with decreased mobility, strength, and balance and will benefit from skilled PT in acute care.    PT Assessment  Patient needs continued PT services    Follow Up Recommendations  Skilled nursing facility    Equipment Recommendations  Defer to next venue    Frequency Min 2X/week    Precautions / Restrictions Precautions Precautions: Fall Restrictions Weight Bearing Restrictions: No   Pertinent Vitals/Pain Pt. C/o pain however unable to localize consistently and unable to rate.      Mobility  Bed Mobility Bed Mobility: Supine to Sit Supine to Sit: 2: Max assist;HOB elevated (HOB 20 degrees) Transfers Transfers: Sit to Stand;Stand to Sit Sit to Stand: 2: Max assist;From bed Stand to Sit: 4: Min guard;With upper extremity assist;To chair/3-in-1 Details for Transfer Assistance: difficulty with initiating tasks initially due to lethargy Ambulation/Gait Ambulation/Gait Assistance: 3: Mod assist Ambulation Distance (Feet): 100 Feet Assistive device: Rolling walker Ambulation/Gait Assistance Details: decreased awareness of R side frequently running into objects Gait Pattern: Step-through pattern;Narrow base of support Stairs: No Wheelchair Mobility Wheelchair Mobility: No    Exercises     PT Goals Acute Rehab PT Goals PT Goal Formulation: Patient unable to participate in goal setting Time For Goal Achievement: 06/01/11 Potential to Achieve Goals: Good Pt will Roll Supine to Right Side: with min assist;with rail PT Goal: Rolling Supine to Right Side - Progress: Goal set today Pt will Roll Supine to Left Side: with min assist;with rail PT Goal:  Rolling Supine to Left Side - Progress: Goal set today Pt will go Supine/Side to Sit: with min assist;with HOB 0 degrees;with rail PT Goal: Supine/Side to Sit - Progress: Goal set today Pt will go Sit to Supine/Side: with min assist;with rail PT Goal: Sit to Supine/Side - Progress: Goal set today Pt will go Sit to Stand: with min assist;with upper extremity assist PT Goal: Sit to Stand - Progress: Goal set today Pt will go Stand to Sit: with min assist;with upper extremity assist PT Goal: Stand to Sit - Progress: Goal set today Pt will Ambulate: >150 feet;with supervision;with rolling walker PT Goal: Ambulate - Progress: Goal set today  Visit Information  Last PT Received On: 05/25/11 Assistance Needed: +1 PT/OT Co-Evaluation/Treatment: Yes    Subjective Data  Subjective: "My left breast hurts. I think it's just gas." Patient Stated Goal: "to be a 10"   Prior Functioning  Home Living Lives With: Family (sister) Available Help at Discharge: Family;Available PRN/intermittently Type of Home: House Home Access: Level entry Home Layout: Two level;Able to live on main level with bedroom/bathroom Alternate Level Stairs-Number of Steps: 12 Alternate Level Stairs-Rails: None Bathroom Shower/Tub: Tub/shower unit;Curtain Bathroom Toilet: Standard Bathroom Accessibility: Yes How Accessible: Accessible via walker Home Adaptive Equipment: Walker - rolling Prior Function Level of Independence: Independent with assistive device(s);Needs assistance Needs Assistance: Bathing;Meal Prep;Dressing Bath: Maximal Dressing: Maximal Meal Prep: Maximal Able to Take Stairs?: Yes Driving: No Vocation: Retired    IT consultant  Overall Cognitive Status: Impaired Area of Impairment: Attention;Memory;Awareness of deficits;Problem solving;Executive functioning Arousal/Alertness: Lethargic Orientation Level: Disoriented to;Situation;Time Behavior During Session: Flat affect Current Attention Level:  Sustained Cognition - Other Comments: slow to process information and respond    Extremity/Trunk Assessment  Left Lower Extremity Assessment LLE ROM/Strength/Tone: WFL for tasks assessed Trunk Assessment Trunk Assessment: Kyphotic   Balance Balance Balance Assessed: No  End of Session PT - End of Session Equipment Utilized During Treatment: Gait belt Activity Tolerance: Patient limited by fatigue Patient left: in chair;with call bell/phone within reach;with nursing in room Nurse Communication: Mobility status   Timoteo Gaul 05/25/2011, 9:52 AM  Nicki Reaper. Feltis, PT, DPT [phone]

## 2011-05-25 NOTE — Progress Notes (Signed)
Clinical Social Work  CSW spoke with sister on 4/18 who stated she wanted to speak with CSW face-to-face in order to talk about patient's dc plans. Per psychiatrist, sister wants patient to return home but wants to speak with patient regarding rules of the home. CSW checked the room throughout the day but sister has not arrived. CSW called sister's number and left a message. CSW will continue to follow.  [place], [place] [phone] (Coverage for [employer])

## 2011-05-25 NOTE — Progress Notes (Signed)
   CARE MANAGEMENT NOTE 05/25/2011  Patient:  Amber Pierce,Amber Pierce   Account Number:  [id number]  Date Initiated:  05/22/2011  Documentation initiated by:  Onnie Boer  Subjective/Objective Assessment:   Pt admitted with AMS     Action/Plan:   PROGRESSION OF CARE AND DISCHARGE PLANNING   Anticipated DC Date:     Anticipated DC Plan:  SKILLED NURSING FACILITY  In-house referral  Clinical Social Worker      DC Planning Services  CM consult      Choice offered to / List presented to:             Status of service:  In process, will continue to follow Medicare Important Message given?   (If response is "NO", the following Medicare IM given date fields will be blank) Date Medicare IM given:   Date Additional Medicare IM given:    Discharge Disposition:    Per UR Regulation:    If discussed at Long Length of Stay Meetings, dates discussed:    Comments:  05/25/11 Onnie Boer, RN, BSN 1536 PT NEEDS SNF AT DC.  WILL F/U WITH CSW.

## 2011-05-25 NOTE — Evaluation (Signed)
Occupational Therapy Evaluation Patient Details Name: Amber Pierce MRN: [id number] DOB: Apr 13, 1940 Today's Date: 05/25/2011 Time: 4098-1191 OT Time Calculation (min): 27 min  OT Assessment / Plan / Recommendation Clinical Impression  This 71 yo female admitted with AMS and bizzare behavior presents to acute OT with problems below thus affecting pts ability to care for herself. Will benefit from acute OT with follow up at SNF.    OT Assessment  Patient needs continued OT Services    Follow Up Recommendations  Skilled nursing facility    Equipment Recommendations  Defer to next venue    Frequency Min 2X/week    Precautions / Restrictions Precautions Precautions: Fall Restrictions Weight Bearing Restrictions: No       ADL  Eating/Feeding: Simulated;Supervision/safety;Set up Where Assessed - Eating/Feeding: Chair Grooming: Performed;Brushing hair;Maximal assistance Where Assessed - Grooming: Standing at sink;Supported sitting (started at sink but then had to go to chair due to fatigue) Upper Body Bathing: Simulated;Supervision/safety;Set up (Max Vc's) Where Assessed - Upper Body Bathing: Supported;Sitting, chair Lower Body Bathing: Simulated;Moderate assistance (Max VC's) Where Assessed - Lower Body Bathing: Supported;Sit to stand from chair Upper Body Dressing: Simulated;Maximal assistance (with Max VC's) Where Assessed - Upper Body Dressing: Supported;Sitting, chair Lower Body Dressing: Performed;+1 Total assistance (Max Vc's) Where Assessed - Lower Body Dressing: Supported;Sit to stand from chair Toilet Transfer: Performed;Minimal assistance Toilet Transfer Method: Proofreader: Raised toilet seat with arms (or 3-in-1 over toilet) (was unable to sit on regular toilet-too low) Toileting - Clothing Manipulation: +1 Total assistance (2 gowns) Where Assessed - Toileting Clothing Manipulation: Standing Toileting - Hygiene:  Performed;Supervision/safety (front peri) Where Assessed - Toileting Hygiene: Sit on 3-in-1 or toilet Tub/Shower Transfer: Not assessed Tub/Shower Transfer Method: Not assessed Equipment Used:  (3-n-1, gait belt) Ambulation Related to ADLs: Min A    OT Goals Acute Rehab OT Goals OT Goal Formulation: With patient Time For Goal Achievement: 06/01/11 Potential to Achieve Goals: Good ADL Goals Pt Will Perform Grooming: with set-up;with supervision;Unsupported;Sitting at sink (2 tasks with min VC's) ADL Goal: Grooming - Progress: Goal set today Pt Will Perform Upper Body Bathing: with set-up;with supervision;Unsupported;Sitting, chair;Sitting, edge of bed (with min VC's) ADL Goal: Upper Body Bathing - Progress: Goal set today Pt Will Perform Lower Body Bathing: with min assist;Supported;Sit to stand from chair;Sit to stand from bed (with min VC's) ADL Goal: Lower Body Bathing - Progress: Goal set today Pt Will Perform Upper Body Dressing: with mod assist;Unsupported;Sitting, bed;Sitting, chair (with min VC's) ADL Goal: Upper Body Dressing - Progress: Goal set today Pt Will Perform Lower Body Dressing: with mod assist;Supported;Sit to stand from chair;Sit to stand from bed (with min VC's) ADL Goal: Lower Body Dressing - Progress: Goal set today Pt Will Transfer to Toilet: with supervision;Ambulation;3-in-1 (over toliet) ADL Goal: Statistician - Progress: Goal set today Pt Will Perform Toileting - Clothing Manipulation: with supervision;Standing ADL Goal: Toileting - Clothing Manipulation - Progress: Goal set today Pt Will Perform Toileting - Hygiene: with supervision;Sit to stand from 3-in-1/toilet ADL Goal: Toileting - Hygiene - Progress: Goal set today Additional ADL Goal #1: Pt will be Supervison for into.OOB. ADL Goal: Additional Goal #1 - Progress: Goal set today  Visit Information  Last OT Received On: 05/25/11 Assistance Needed: +1 PT/OT Co-Evaluation/Treatment: Yes (partial)     Subjective Data  Subjective: I want to be a 10.  (When I asked her about her pain and she said it was moderate and I said so you are about  a 5 out of 10 for pain). Explained to her that she wanted to be a 0 not a 10  (I really think she meant a 10 for who is as opposed to referencing pain. Patient Stated Goal: When I asked she said the above under subjective   Prior Functioning  Home Living Lives With: Family (sister) Available Help at Discharge: Family;Available PRN/intermittently Type of Home: House Home Access: Level entry Home Layout: Two level;Able to live on main level with bedroom/bathroom Alternate Level Stairs-Number of Steps: 12 Alternate Level Stairs-Rails: None Bathroom Shower/Tub: Tub/shower unit;Curtain Bathroom Toilet: Standard Bathroom Accessibility: Yes How Accessible: Accessible via walker Home Adaptive Equipment: Walker - rolling Prior Function Level of Independence: Independent with assistive device(s);Needs assistance Needs Assistance: Light Housekeeping Bath: Moderate Dressing: Moderate Meal Prep: Total Light Housekeeping: Total Able to Take Stairs?: Yes Driving: No Vocation: Retired Musician:  (delayed responses) Dominant Hand: Right    Cognition  Overall Cognitive Status: History of cognitive impairments - further impaired Area of Impairment: Attention;Memory;Following commands;Safety/judgement;Problem solving Arousal/Alertness: Lethargic Orientation Level: Disoriented to;Situation;Time Behavior During Session: Flat affect Current Attention Level: Sustained Memory Deficits: decreased recall of questions asked about home Following Commands: Follows one step commands with increased time Safety/Judgement: Decreased safety judgement for tasks assessed Problem Solving: [place] appeared too low for her, but she never said anything she just stood hovered over the toliet leaning way far foreward. We had to ask her to stand up and show her  that we could put at 3-n-1 over her toliet to make it higher (3-n-1 was just in front of her in the shower stall). Cognition - Other Comments: slow to process information and respond    Extremity/Trunk Assessment Left Lower Extremity Assessment LLE ROM/Strength/Tone: WFL for tasks assessed Trunk Assessment Trunk Assessment: Kyphotic   Mobility Bed Mobility Bed Mobility: Supine to Sit Supine to Sit: 2: Max assist;HOB elevated (HOB 20 degrees) Transfers Sit to Stand: 2: Max assist;From bed Stand to Sit: 4: Min guard;With upper extremity assist;To chair/3-in-1 Details for Transfer Assistance: difficulty with initiating tasks initially due to lethargy   Exercise    Balance Balance Balance Assessed: No  End of Session OT - End of Session Equipment Utilized During Treatment: Gait belt (RW, 3-n-1) Activity Tolerance: Patient limited by fatigue Patient left: in chair;with call bell/phone within reach (sitter in room) Nurse Communication: Mobility status (transfers with sitter (no RW bed to chair, yes RW to bathroo)   Evette Georges 05/25/2011, 10:25 AM

## 2011-05-25 NOTE — Consult Note (Signed)
Reason for Consult:Seizure Disorder, Pseudoseizures Referring Physician: Dr. Zane Herald Lauramae Kneisley is an 71 y.o. female.  HPI: Amber Pierce is an 71 y.o. 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 Her sister states she has obsessive-compulsive tendency especially about cleanliness, but this past few days, she has been picking up very small tweaks on a walkway, spending an inordinate amount of time folding her blanket over the over again, chasing her sister to give her kisses. She is alert and oriented, but appears to have flat affect. Evaluation included a negative head CT, negative urinary drug screen, valproic acid level of 47, slight elevation of white count of 11.9 thousand, potassium of 3.9, creatinine of 0.7 and serum sodium of 131. Hospitalist was asked to admit her for altered mental status workup.   AXIS I OCD Altered mental status Tobacco Dependence AXIS II Deferred  AXIS III Past Medical History  Diagnosis Date  . Hypertension   . Thyroid disease   . Seizures   . Arthritis   . GERD (gastroesophageal reflux disease)   . Anxiety     History reviewed. No pertinent past surgical history. AXIS IV Supportive sister, loss of independent care, finances, mental health/seizures, increased risk gait instability AXIS V  GAF  45  No family history on file.  Social History:  reports that she has been smoking.  She does not have any smokeless tobacco history on file. She reports that she does not drink alcohol. Her drug history not on file.  Allergies: No Known Allergies  Medications: I have reviewed the patient's current medications.  Results for orders placed during the hospital encounter of 05/21/11 (from the past 48 hour(s))  BASIC METABOLIC PANEL     Status: Abnormal   Collection Time   05/24/11  4:40 AM      Component Value  Range Comment   Sodium 134 (*) 135 - 145 (mEq/L)    Potassium 3.4 (*) 3.5 - 5.1 (mEq/L)    Chloride 93 (*) 96 - 112 (mEq/L)    CO2 28  19 - 32 (mEq/L)    Glucose, Bld 83  70 - 99 (mg/dL)    BUN 9  6 - 23 (mg/dL)    Creatinine, Ser 6.29 (*) 0.50 - 1.10 (mg/dL)    Calcium 52.8  8.4 - 10.5 (mg/dL)    GFR calc non Af Amer >90  >90 (mL/min)    GFR calc Af Amer >90  >90 (mL/min)   MAGNESIUM     Status: Normal   Collection Time   05/24/11  4:40 AM      Component Value Range Comment   Magnesium 2.1  1.5 - 2.5 (mg/dL)   CBC     Status: Abnormal   Collection Time   05/24/11  4:40 AM      Component Value Range Comment   WBC 14.0 (*) 4.0 - 10.5 (K/uL)    RBC 4.26  3.87 - 5.11 (MIL/uL)    Hemoglobin 13.8  12.0 - 15.0 (g/dL)    HCT 41.3  24.4 - 01.0 (%)    MCV 92.5  78.0 - 100.0 (fL)    MCH 32.4  26.0 - 34.0 (pg)    MCHC 35.0  30.0 - 36.0 (g/dL)    RDW 27.2  53.6 - 64.4 (%)    Platelets 544 (*) 150 - 400 (K/uL)  BASIC METABOLIC PANEL     Status: Abnormal   Collection Time   05/25/11  6:50 AM      Component Value Range Comment   Sodium 132 (*) 135 - 145 (mEq/L)    Potassium 3.9  3.5 - 5.1 (mEq/L)    Chloride 96  96 - 112 (mEq/L)    CO2 27  19 - 32 (mEq/L)    Glucose, Bld 84  70 - 99 (mg/dL)    BUN 16  6 - 23 (mg/dL)    Creatinine, Ser 1.61  0.50 - 1.10 (mg/dL)    Calcium 8.7  8.4 - 10.5 (mg/dL)    GFR calc non Af Amer 87 (*) >90 (mL/min)    GFR calc Af Amer >90  >90 (mL/min)   URINALYSIS, ROUTINE W REFLEX MICROSCOPIC     Status: Abnormal   Collection Time   05/25/11  3:17 PM      Component Value Range Comment   Color, Urine YELLOW  YELLOW     APPearance CLEAR  CLEAR     Specific Gravity, Urine 1.025  1.005 - 1.030     pH 6.0  5.0 - 8.0     Glucose, UA NEGATIVE  NEGATIVE (mg/dL)    Hgb urine dipstick TRACE (*) NEGATIVE     Bilirubin Urine NEGATIVE  NEGATIVE     Ketones, ur NEGATIVE  NEGATIVE (mg/dL)    Protein, ur 30 (*) NEGATIVE (mg/dL)    Urobilinogen, UA 0.2  0.0 - 1.0 (mg/dL)     Nitrite NEGATIVE  NEGATIVE     Leukocytes, UA NEGATIVE  NEGATIVE    URINE MICROSCOPIC-ADD ON     Status: Abnormal   Collection Time   05/25/11  3:17 PM      Component Value Range Comment   Squamous Epithelial / LPF RARE  RARE     RBC / HPF 3-6  <3 (RBC/hpf)    Casts HYALINE CASTS (*) NEGATIVE     Urine-Other MUCOUS PRESENT       Dg Chest 2 View  05/25/2011  *RADIOLOGY REPORT*  Clinical Data:Recent onset fever.  CHEST - 2 VIEW  Comparison: None.  Findings: There is no focal airspace disease to suggest pneumonia. Lungs appear clear.  Heart size is normal.  No pneumothorax or pleural fluid.  IMPRESSION: No acute disease.  Original Report Authenticated By: Bernadene Bell. Maricela Curet, M.D.    Review of Systems  Unable to perform ROS: other   Blood pressure 136/82, pulse 67, temperature 98.2 F (36.8 C), temperature source Oral, resp. rate 18, height 5\' 1"  (1.549 m), weight 45 kg (99 lb 3.3 oz), SpO2 98.00%. Physical Exam  Assessment/Plan:Chart review, Discussed with Dr. Mahala Menghini and Psych CSW Pt has not charted occurrence of seizures today.  She is greeted in am following PT.  She is semi-conscious appearing very tired.  She says a few words but is unable to engage in an interview.  CSW checked on pt several witmes and LM for sister, [name].  PT recommends SNF due to her unsteady gait. Sister, [name] requests psychiatric  inpatient admission for pt.  This is discouraged because her sister's [pt;s] behavior is chronic and is most effectively treated on long-term outpatient basis.  If sister cannot think of receiving pt at home, she needs to discuss this with CSW.  This reversal of saying pt may come home to live with her has been discussed and current plan; after SNF PT rehab, is to discuss home rules and agreement before  pt is discharged.  Psych CSW will pursue meeting on Monday 05/28/11  RECOMMENDATION 1 Monitor incidence of recurrent seizures: ck VPA level 2. Luvox dose may be titrated to 100 mg 2 X  daily [if tolerated - or at least to 150 mg] 3. Family conference pending 4. Pt may be transferred to SNF when medically stable.   Ravin Bendall 05/25/2011, 6:43 PM

## 2011-05-26 LAB — VALPROIC ACID LEVEL: Valproic Acid Lvl: 91.8 ug/mL (ref 50.0–100.0)

## 2011-05-26 LAB — URINE CULTURE: Colony Count: NO GROWTH

## 2011-05-26 MED ORDER — LEVOFLOXACIN IN D5W 750 MG/150ML IV SOLN
750.0000 mg | INTRAVENOUS | Status: DC
  Administered 2011-05-26: 750 mg via INTRAVENOUS
  Filled 2011-05-26: qty 150

## 2011-05-26 MED ORDER — FLUVOXAMINE MALEATE 50 MG PO TABS
75.0000 mg | ORAL_TABLET | Freq: Two times a day (BID) | ORAL | Status: DC
  Administered 2011-05-26: 75 mg via ORAL
  Administered 2011-05-27: 11:00:00 via ORAL
  Administered 2011-05-27 – 2011-05-29 (×4): 75 mg via ORAL
  Filled 2011-05-26 (×7): qty 2
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
## 2011-05-27 LAB — CBC
HCT: 31.1 % — ABNORMAL LOW (ref 36.0–46.0)
Hemoglobin: 10.8 g/dL — ABNORMAL LOW (ref 12.0–15.0)
MCH: 32.1 pg (ref 26.0–34.0)
MCHC: 34.7 g/dL (ref 30.0–36.0)
RDW: 14 % (ref 11.5–15.5)

## 2011-05-27 LAB — BASIC METABOLIC PANEL
BUN: 11 mg/dL (ref 6–23)
Calcium: 9 mg/dL (ref 8.4–10.5)
Creatinine, Ser: 0.62 mg/dL (ref 0.50–1.10)
GFR calc Af Amer: >90 mL/min (ref >90)
GFR calc non Af Amer: 89 mL/min — ABNORMAL LOW (ref >90)
Glucose, Bld: 117 mg/dL — ABNORMAL HIGH (ref 70–99)
Potassium: 4 mEq/L (ref 3.5–5.1)

## 2011-05-27 MED ORDER — AMLODIPINE BESYLATE 10 MG PO TABS
10.0000 mg | ORAL_TABLET | Freq: Every day | ORAL | Status: DC
  Administered 2011-05-28 – 2011-05-29 (×2): 10 mg via ORAL
  Filled 2011-05-27 (×2): qty 1

## 2011-05-27 MED ORDER — LEVOFLOXACIN 750 MG PO TABS
750.0000 mg | ORAL_TABLET | ORAL | Status: DC
  Administered 2011-05-28: 750 mg via ORAL
  Filled 2011-05-27: qty 1

## 2011-05-27 NOTE — Evaluation (Signed)
Clinical/Bedside Swallow Evaluation Patient Details  Name: Amber Pierce MRN: [id number] DOB: 1940/03/22 Today's Date: 05/27/2011  Past Medical History:  Past Medical History  Diagnosis Date  . Hypertension   . Thyroid disease   . Seizures   . Arthritis   . GERD (gastroesophageal reflux disease)   . Anxiety    Past Surgical History: History reviewed. No pertinent past surgical history. HPI: 71 y/o female with history of anxiety, HTN, OCD,  partial seizure on valproic acid, and thyroid disease brought to ED on 05/21/11  by her sister due to AMS. CT completed on 4/15 indicates no evidence for acute intracranial abnormality. Patient referred for a BSE secondary to reports of AMS and recent fever.  Patient denies prior reports of dysphagia .      Assessment/Recommendations/Treatment Plan  Oropharyngeal swallow WFL with no outward s/s of aspiration or dysphagia indicated. Intermittent throat clears noted moderately after PO trials which could indicate reflux vs. Residuals.  Lung sounds are "clear" per nursing s/p morning meal. Recommend to continue current diet of regular consistency and thin liquids with intermittent supervision. No further ST indicated in acute care setting and next level of care.     SLP Assessment Risk for Aspiration: Mild  Swallow Evaluation Recommendations Diet Recommendations: Regular;Thin liquid Liquid Administration via: Cup;Straw Medication Administration: Whole meds with liquid Supervision: Patient able to self feed;Full supervision/cueing for compensatory strategies Compensations: Slow rate;Small sips/bites;Follow solids with liquid;Clear throat intermittently Postural Changes and/or Swallow Maneuvers: Out of bed for meals;Upright 30-60 min after meal Oral Care Recommendations: Oral care BID Follow up Recommendations: None      General  Date of Onset: 05/26/11 Type of Study: Bedside swallow evaluation Previous Swallow Assessment: No reports of prior  evaluations  Diet Prior to this Study: Regular;Thin liquids Temperature Spikes Noted: No Respiratory Status: Room air History of Intubation: No Behavior/Cognition: Agitated;Distractible;Decreased sustained attention;Doesn't follow directions Oral Cavity - Dentition: Adequate natural dentition Vision: Functional for self-feeding Patient Positioning: Upright in bed Baseline Vocal Quality: Clear Volitional Cough: Strong Volitional Swallow: Able to elicit  Oral Motor/Sensory Function  Overall Oral Motor/Sensory Function: Appears within functional limits for tasks assessed  Consistency Results  Ice Chips Ice chips: Not tested  Thin Liquid Thin Liquid: Within functional limits Presentation: Cup;Straw     Honey Thick Liquid Honey Thick Liquid: Not tested  Puree Puree: Within functional limits Presentation: Self Fed  Solid Solid: Within functional limits Presentation: Self Lorretta Harp MS, CCC-SLP [phone]  [facility] 05/27/2011,6:14 PM
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
## 2011-05-28 LAB — BASIC METABOLIC PANEL
BUN: 14 mg/dL (ref 6–23)
Calcium: 9.1 mg/dL (ref 8.4–10.5)
Chloride: 93 mEq/L — ABNORMAL LOW (ref 96–112)
Creatinine, Ser: 0.59 mg/dL (ref 0.50–1.10)
GFR calc Af Amer: >90 mL/min (ref >90)
GFR calc non Af Amer: >90 mL/min (ref >90)

## 2011-05-28 LAB — CBC
HCT: 32.9 % — ABNORMAL LOW (ref 36.0–46.0)
MCHC: 33.4 g/dL (ref 30.0–36.0)
Platelets: 420 10*3/uL — ABNORMAL HIGH (ref 150–400)
RDW: 14.1 % (ref 11.5–15.5)
WBC: 10.2 10*3/uL (ref 4.0–10.5)

## 2011-05-28 MED ORDER — FLUVOXAMINE MALEATE 100 MG PO TABS: 100.0000 mg | ORAL_TABLET | Freq: Two times a day (BID) | ORAL | Status: AC

## 2011-05-28 MED ORDER — AMLODIPINE BESYLATE 10 MG PO TABS: 10.0000 mg | ORAL_TABLET | Freq: Every day | ORAL | Status: AC

## 2011-05-28 MED ORDER — NICOTINE 14 MG/24HR TD PT24: 1.0000 | MEDICATED_PATCH | Freq: Every day | TRANSDERMAL | Status: AC

## 2011-05-28 MED ORDER — LEVOFLOXACIN 750 MG PO TABS: 750.0000 mg | ORAL_TABLET | ORAL | Status: AC

## 2011-05-28 NOTE — Discharge Summary (Addendum)
[facility] Hospital Discharge Summary  Note-patient didn't leave until 4/23 due to lack of SNF bed No signif changes  Date of Admission: 05/21/2011  6:52 PM Admitter: @ADMITPROV @   Date of Discharge4/23/2013 Attending Physician: Rhetta Mura, MD  Things to Follow-up on: Get a bmet in 5 days  Patient needs outpatient psychiatric treatment followup    Devika Dragovich WGN:[id number] DOB: Jan 23, 1941 DOA: 05/21/2011 PCP: No primary provider on file.  Brief narrative: 71 y/o female brought to hospital for about 1 weeks of altered mental status. She has a h/o partial seizures and developed them here on the night of the 16th. She was given Ativan which resolved the seizures. Depakote was increased and she was monitored in SDU with a sitter. It has been 24 hrs and she is seizure freee.   Psych eval was done for her altered mental status- Dr Ferol Luz did not feel patient met inpatient criteria but did d/c Zoloft and started fluvoxamine (Luvox) for her OCD symptoms.  Her sister further states that she is not around 24 hrs to take her of the patient and feels that she needs to go somewhere with 24 hr care. Patient did not meet inpatient criteria for psychiatric hospitalization Sister was somewhat concerned of her use of opiates and possible withdrawal of the same which was unfounded. Patient developed a fever over hospitalization and was treated empirically with Levaquin. No acute localizing source was found. She'll continue this in the outpatient for 3-5 days. 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 Altered mental status-per psychiatrist. Seems to be possibly be at baseline.  She was found to be more lethargic when admitted however this gradually improved and her medications were adjusted. Patient expressed more concrete and coherent thoughts although did have an overlay of anxiety. She likely will need psychiatric followup on discharge 2. OCD-needs to continue Fluvoxamine- recommends titrate 100 mg twice daily and if tolerated. Patient does not meet inpatient criteria for psychiatry admission. Her valproic level was done numerous times during hospitalization and was found to be therapeutic and adjust meds accordingly.     3. Seizure disorder with seizure during hospitalization-patient had focal seizures on the 16th, loaded with Depakote. Will continue current regimen.  4. Hypokalemia-resolved.  5. Hyponatremia-sodium lower than prior.  Would allow fluids, but not more than 1.2 liters a day.  Patient did drink a lot of water during hospitalization and it was felt that her hyponatremia multifactorial in terms of her not eating much, being on psychiatric medications and excess intake of water. Patient was encouraged to eat more to increase a lesion that 6. Hypertension-moderate poorly controlled. Hydrochlorothiazide discontinued secondary to hyponatremia. Currently not on any medications. Will increase amlodipine 5 mg->10 mg add to metoprolol 100 po qd.  7. Thyroid disease-TSH = 1.94. Continue Synthroid 25  8. Normocytic anemia-resolved  9. Tobacco habituation-nicotine patch  10. Leukocytosis of 14- chest x-ray and urinalysis unremarkeable from . She had a spike in temperature last night to 101.3.  Her chest x-ray was negative and her urine culture done 4/20 were clear. I will keep her on Levaquin for 3 days by mouth from tomorrow and get a  CBC-Her White count dropped from 14.0-->13.7-->10.  She will complete 3-4 days of PO levaquin.  She has had no further high fever and did not have any further symptoms. 11. Debility-physical therapy saw patient during hospitalization and recommended skilled nursing placement. This was discussed with family who understood this.  Procedures Performed and pertinent labs: Dg Chest 2 View  05/25/2011  *RADIOLOGY REPORT*  Clinical Data:Recent onset fever.  CHEST - 2 VIEW  Comparison: None.  Findings: There is no focal airspace disease to suggest pneumonia. Lungs appear clear.  Heart size is normal.  No pneumothorax or pleural fluid.  IMPRESSION: No acute disease.  Original Report Authenticated By: Bernadene Bell. Maricela Curet, M.D.   Ct Head Wo Contrast  05/21/2011  *RADIOLOGY REPORT*  Clinical Data: Confusion and disorientation since yesterday. Symptoms are worse today.  CT HEAD WITHOUT CONTRAST  Technique:  Contiguous axial images were obtained from the base of the skull through the vertex without contrast.  Comparison: None  Findings:  There is mild central cortical atrophy.  Periventricular white matter changes are consistent with small vessel disease. There is no evidence for hemorrhage, mass lesion, or acute infarction.  Bone windows show atherosclerotic calcification of the internal carotid arteries and are otherwise unremarkable.  IMPRESSION: 1.  Mild atrophy and small vessel disease.  2. No evidence for acute intracranial abnormality. .  Original Report Authenticated By: Patterson Hammersmith, M.D.    Discharge Vitals & PE:  BP 133/68  Pulse 69  Temp(Src) 97.7 F (36.5 C) (Oral)  Resp 16  Ht 5\' 1"  (1.549 m)  Wt 45 kg (99 lb 3.3 oz)  BMI 18.74 kg/m2  SpO2 99% General:  Oriented and understands where she is, who is the president, and other than Hospital seems oriented Initially hesistant to allow me to examine her Cardiovascular: S1-S2 no murmur rub or gallop Respiratory: Clinically clear, no added  sound Abdomen: Soft nontender and nondistended Skin no lower extremity edema Neuro moves all 4 extremities equally and no focal deficit grossly note   Discharge Labs:  Results for orders placed during the hospital encounter of 05/21/11 (from the past 24 hour(s))  BASIC METABOLIC PANEL     Status: Abnormal   Collection Time   05/28/11  5:08 AM      Component Value Range   Sodium 130 (*) 135 - 145 (mEq/L)   Potassium 4.3  3.5 - 5.1 (mEq/L)   Chloride 93 (*) 96 - 112 (mEq/L)   CO2 23  19 - 32 (mEq/L)   Glucose, Bld 86  70 - 99 (mg/dL)   BUN 14  6 - 23 (mg/dL)   Creatinine, Ser 0.45  0.50 - 1.10 (mg/dL)   Calcium 9.1  8.4 - 40.9 (mg/dL)   GFR calc non Af Amer >90  >90 (mL/min)   GFR calc Af Amer >90  >90 (mL/min)  CBC     Status: Abnormal   Collection Time   05/28/11  5:08 AM      Component Value Range   WBC 10.2  4.0 - 10.5 (K/uL)   RBC 3.50 (*) 3.87 - 5.11 (MIL/uL)   Hemoglobin 11.0 (*) 12.0 - 15.0 (g/dL)   HCT 81.1 (*) 91.4 - 46.0 (%)   MCV 94.0  78.0 - 100.0 (fL)   MCH 31.4  26.0 - 34.0 (pg)  MCHC 33.4  30.0 - 36.0 (g/dL)   RDW 16.1  09.6 - 04.5 (%)   Platelets 420 (*) 150 - 400 (K/uL)    Disposition and follow-up:   Amber Pierce was discharged from in fair condition.    Follow-up Appointments:    Discharge Medications: Medication List  As of 05/28/2011  7:58 AM   STOP taking these medications         hydrochlorothiazide 12.5 MG capsule         TAKE these medications         ALPRAZolam 1 MG tablet   Commonly known as: XANAX   Take 0.5-1 mg by mouth 3 (three) times daily as needed. For anxiety      amLODipine 10 MG tablet   Commonly known as: NORVASC   Take 1 tablet (10 mg total) by mouth daily.      divalproex 500 MG DR tablet   Commonly known as: DEPAKOTE   Take 500 mg by mouth daily.      fluvoxaMINE 100 MG tablet   Commonly known as: LUVOX   Take 1 tablet (100 mg total) by mouth 2 (two) times daily.      HEMATINIC PLUS VIT/MINERALS PO    Take 1 tablet by mouth daily.      hydroxychloroquine 200 MG tablet   Commonly known as: PLAQUENIL   Take 400 mg by mouth daily.      levofloxacin 750 MG tablet   Commonly known as: LEVAQUIN   Take 1 tablet (750 mg total) by mouth every other day.      levothyroxine 25 MCG tablet   Commonly known as: SYNTHROID, LEVOTHROID   Take 25 mcg by mouth daily.      metoprolol 50 MG tablet   Commonly known as: LOPRESSOR   Take 50 mg by mouth daily.      mirtazapine 15 MG tablet   Commonly known as: REMERON   Take 15 mg by mouth at bedtime.      nicotine 14 mg/24hr patch   Commonly known as: NICODERM CQ - dosed in mg/24 hours   Place 1 patch onto the skin daily.      oxyCODONE-acetaminophen 5-325 MG per tablet   Commonly known as: PERCOCET   Take 1 tablet by mouth every 6 (six) hours as needed. For pain      sertraline 50 MG tablet   Commonly known as: ZOLOFT   Take 50 mg by mouth daily.      vitamin B-12 500 MCG tablet   Commonly known as: CYANOCOBALAMIN   Take 500 mcg by mouth daily.           Medications Discontinued During This Encounter  Medication Reason  . valproate (DEPACON) injection 500 mg Entry Error  . sertraline (ZOLOFT) tablet 50 mg   . divalproex (DEPAKOTE) DR tablet 500 mg   . ALPRAZolam (XANAX) tablet 0.5-1 mg   . mirtazapine (REMERON) tablet 15 mg   . oxyCODONE-acetaminophen (PERCOCET) 5-325 MG per tablet 1 tablet   . hydrochlorothiazide (MICROZIDE) capsule 12.5 mg   . metoprolol (LOPRESSOR) tablet 50 mg   . LORazepam (ATIVAN) injection 0.5 mg   . hydrALAZINE (APRESOLINE) injection 10 mg   . amLODipine (NORVASC) 1 mg/mL oral suspension 5 mg Formulary change  . fluvoxaMINE (LUVOX) tablet 25 mg   . levofloxacin (LEVAQUIN) IVPB 750 mg   . amLODipine (NORVASC) tablet 5 mg     > 40 minutes time spent preparing d/c summary,  including direct face-face patient Time, contact with consultants, family and care coordination   Signed: Astella Desir,JAI 05/28/2011,  7:48 AM

## 2011-05-28 NOTE — Progress Notes (Signed)
Psych CSW did not set up family mtg for today (Monday) as dc plan has changed from going home with her sister to going to a skilled nursing facility.  Hospitalist social worker followed up with Pt's family and is searching for SNF at this time. Frederico Hamman, LCSW [phone] covering for psych CSW.

## 2011-05-28 NOTE — Progress Notes (Signed)
Patient Amber Pierce, 71 year old white female struggles with several health issues.  But her main anxiety is over lack of access to cigarettes.  She has not smoked for one week.  Patient thanked Orthoptist for providing pastoral presence, prayer, and conversation.

## 2011-05-28 NOTE — Progress Notes (Signed)
Clinical Social Work Department BRIEF PSYCHOSOCIAL ASSESSMENT 05/28/2011  Patient:  Amber Pierce,Amber Pierce     Account Number:  [id number]     Admit date:  05/21/2011  Clinical Social Worker:  Hulan Fray  Date/Time:  05/28/2011 03:18 PM  Referred by:  Physician  Date Referred:  05/28/2011 Referred for  SNF Placement   Other Referral:   Interview type:  Family Other interview type:   Sister- Amber Pierce [phone]    PSYCHOSOCIAL DATA Living Status:  SIBLING Admitted from facility:   Level of care:   Primary support name:  Amber Pierce Primary support relationship to patient:  SIBLING Degree of support available:   adequate    CURRENT CONCERNS Current Concerns  Post-Acute Placement   Other Concerns:    SOCIAL WORK ASSESSMENT / PLAN Clinical Social Worker is following up with patient today from referral of SNF placement. Patient had been followed by the psych social worker. This CSW spoke with patient's sister, Amber Pierce and she is agreeable to patient being sent to SNF. Patient is from [place], [place] and her sister is taking care of her. Amber Pierce is preferring [address], [address] and [place], [place] areas. CSW completed FL2 with MD's signature and faxed patient's information to preferred locations. CSW will continue to follow and update family when bed offers are made.   Assessment/plan status:  Psychosocial Support/Ongoing Assessment of Needs Other assessment/ plan:   CSW spoke with representative , Deanna Dolen from [facility] and they have denied patient authorization number for SNF in [place]. CSW will look in [place] for SNF's and update when bed offers are made.   Information/referral to community resources:    PATIENT'S/FAMILY'S RESPONSE TO PLAN OF CARE: Amber Pierce is agreeable to sending patient to SNF for short term and then plan is for patient to live with her once her short term stay at SNF is complete.      Rozetta Nunnery MSW, [employer] [phone]

## 2011-05-28 NOTE — Progress Notes (Signed)
   CARE MANAGEMENT NOTE 05/28/2011  Patient:  Amber Pierce,Amber Pierce   Account Number:  [id number]  Date Initiated:  05/22/2011  Documentation initiated by:  Onnie Boer  Subjective/Objective Assessment:   Pt admitted with AMS     Action/Plan:   PROGRESSION OF CARE AND DISCHARGE PLANNING   Anticipated DC Date:     Anticipated DC Plan:  SKILLED NURSING FACILITY  In-house referral  Clinical Social Worker      DC Planning Services  CM consult      Choice offered to / List presented to:             Status of service:  In process, will continue to follow Medicare Important Message given?   (If response is "NO", the following Medicare IM given date fields will be blank) Date Medicare IM given:   Date Additional Medicare IM given:    Discharge Disposition:    Per UR Regulation:    If discussed at Long Length of Stay Meetings, dates discussed:    Comments:  05/28/11 Onnie Boer, RN, BSN [phone] PT WILL NEED SNF AND IS READY FOR DC.  CSW AWARE.  05/25/11 Onnie Boer, RN, BSN [id number] PT NEEDS SNF AT DC.  WILL F/U WITH CSW.

## 2011-05-29 MED ORDER — OXYCODONE-ACETAMINOPHEN 5-325 MG PO TABS: 1.0000 | ORAL_TABLET | Freq: Four times a day (QID) | ORAL | Status: AC | PRN

## 2011-05-29 MED ORDER — ALPRAZOLAM 0.5 MG PO TABS: 0.5000 mg | ORAL_TABLET | Freq: Three times a day (TID) | ORAL | Status: AC | PRN

## 2011-05-29 NOTE — Progress Notes (Signed)
  Brief follow-up note  No significant changes from prior. Patient doing well, seems much more at baseline.  No c/o today, about to eat lunch.  BP 121/80  Pulse 68  Temp(Src) 97.1 F (36.2 C) (Oral)  Resp 18  Ht 5\' 1"  (1.549 m)  Wt 45 kg (99 lb 3.3 oz)  BMI 18.74 kg/m2  SpO2 99% General: Oriented and understands where she is, who is the president, and other than Hospital seems oriented  Initially hesistant to allow me to examine her  Cardiovascular: S1-S2 no murmur rub or gallop  Respiratory: Clinically clear, no added sound  Abdomen: Soft nontender and nondistended  Skin no lower extremity edema  Neuro moves all 4 extremities equally and no focal deficit grossly note  Assessment/Plan:  1. Altered mental status-per psychiatrist. Seems to be possibly be at baseline. She was found to be more lethargic when admitted however this gradually improved and her medications were adjusted. Patient expressed more concrete and coherent thoughts although did have an overlay of anxiety. She likely will need psychiatric followup on discharge 2. OCD-needs to continue Fluvoxamine- recommends titrate 100 mg twice daily and if tolerated. Patient does not meet inpatient criteria for psychiatry admission. Her valproic level was done numerous times during hospitalization and was found to be therapeutic and adjust meds accordingly.  3. Seizure disorder with seizure during hospitalization-patient had focal seizures on the 16th, loaded with Depakote. Will continue current regimen.  4. Hypokalemia-resolved.  5. Hyponatremia-sodium lower than prior. Would allow fluids, but not more than 1.2 liters a day. Patient did drink a lot of water during hospitalization and it was felt that her hyponatremia multifactorial in terms of her not eating much, being on psychiatric medications and excess intake of water. Patient was encouraged to eat more to increase a lesion that 6. Hypertension-moderate poorly controlled.  Hydrochlorothiazide discontinued secondary to hyponatremia. . Will increase amlodipine 5 mg->10 mg add to metoprolol 100 po qd.  7. Thyroid disease-TSH = 1.94. Continue Synthroid 25  8. Normocytic anemia-resolved  9. Tobacco habituation-nicotine patch  10. Leukocytosis of 14- chest x-ray and urinalysis unremarkeable from . She had a spike in temperature last night to 101.3. Her chest x-ray was negative and her urine culture done 4/20 were clear. I will keep her on Levaquin for 3 days by mouth from tomorrow and get a CBC-Her White count dropped from 14.0-->13.7-->10. She will complete 3-4 days of PO levaquin. She has had no further high fever and did not have any further symptoms. 11. Debility-physical therapy saw patient during hospitalization and recommended skilled nursing placement. This was discussed with family who understood this.  Pleas Koch, MD [facility] [phone]

## 2011-05-29 NOTE — Progress Notes (Signed)
Physical Therapy Treatment Patient Details Name: Amber Pierce MRN: [id number] DOB: 01-12-1941 Today's Date: 05/29/2011 Time:  -     PT Assessment / Plan / Recommendation Comments on Treatment Session  Pt was able to improve spatial awareness during amb. Pt demo good balance with DGI activities. Recommended pt amb daily with nursing staff to improve gains made.     Follow Up Recommendations  Skilled nursing facility    Equipment Recommendations  Defer to next venue    Frequency Min 2X/week   Plan All goals met and education completed, patient dischaged from PT services    Precautions / Restrictions Precautions Precautions: Fall Restrictions Weight Bearing Restrictions: No   Pertinent Vitals/Pain     Mobility  Bed Mobility Bed Mobility: Rolling Left;Sitting - Scoot to Edge of Bed;Supine to Sit Rolling Left: 5: Supervision Supine to Sit: 5: Supervision Sitting - Scoot to Edge of Bed: 5: Supervision Transfers Transfers: Sit to Stand;Stand to Sit Sit to Stand: 5: Supervision;From chair/3-in-1;From bed;With armrests;With upper extremity assist Stand to Sit: 5: Supervision;With upper extremity assist;To chair/3-in-1;To bed Ambulation/Gait Ambulation/Gait Assistance: 5: Supervision Ambulation Distance (Feet): 250 Feet Assistive device: Rolling walker Ambulation/Gait Assistance Details: Pt showed improved spatial awareness but needed VC to correct RW to avoid running into objects. Gait Pattern: Within Functional Limits;Step-through pattern Stairs: No Wheelchair Mobility Wheelchair Mobility: No    Exercises     PT Goals Acute Rehab PT Goals PT Goal: Supine/Side to Sit - Progress: Met PT Goal: Sit to Supine/Side - Progress: Met PT Goal: Stand to Sit - Progress: Met PT Goal: Ambulate - Progress: Met  Visit Information  Last PT Received On: 05/29/11    Subjective Data  Subjective: Pt stated she was having a hard to seeing out of her R eye   Cognition  Overall  Cognitive Status: History of cognitive impairments - further impaired Orientation Level: Appears intact for tasks assessed Behavior During Session: [facility] for tasks performed    Balance     End of Session PT - End of Session Activity Tolerance: Patient tolerated treatment well Patient left: in bed;with call bell/phone within reach;with bed alarm set Nurse Communication: Mobility status    Tamera Stands 05/29/2011, 12:47 PM Robinette,Julia,PTA

## 2011-05-29 NOTE — Progress Notes (Signed)
Clinical Child psychotherapist facilitated discharge by contacting family and facility, [facility] in [place]. Patient's sister, [name] will be transporting patient to facility. Patient's discharge packet will be placed in patient's room. CSW will sign off, as social work intervention is no longer needed.   Rozetta Nunnery MSW, [employer] [phone]

## 2011-05-29 NOTE — Progress Notes (Signed)
Pt sister has arrived. Pt d/c to snf by car with family. Assessment stable, snf packet given.

## 2011-05-29 NOTE — Progress Notes (Signed)
Clinical Social Work Department CLINICAL SOCIAL WORK PLACEMENT NOTE 05/29/2011  Patient:  [name],Amber Pierce  Account Number:  [id number] Admit date:  05/21/2011  Clinical Social Worker:  Rozetta Nunnery, Theresia Majors  Date/time:  05/29/2011 01:18 PM  Clinical Social Work is seeking post-discharge placement for this patient at the following level of care:   SKILLED NURSING   (*CSW will update this form in Epic as items are completed)   05/28/2011  Patient/family provided with [facility] Department of Clinical Social Work's list of facilities offering this level of care within the geographic area requested by the patient (or if unable, by the patient's family).  05/28/2011  Patient/family informed of their freedom to choose among providers that offer the needed level of care, that participate in Medicare, Medicaid or managed care program needed by the patient, have an available bed and are willing to accept the patient.    Patient/family informed of [facility]' ownership interest in [facility], as well as of the fact that they are under no obligation to receive care at this facility.  [facility] submitted to [facility] on 05/28/2011 [facility] number received from [employer] on   FL2 transmitted to all facilities in geographic area requested by pt/family on  05/28/2011 FL2 transmitted to all facilities within larger geographic area on 05/28/2011  Patient informed that his/her managed care company has contracts with or will negotiate with  certain facilities, including the following:     Patient/family informed of bed offers received:  05/29/2011 Patient chooses bed at [facility] Physician recommends and patient chooses bed at    Patient to be transferred to [facility] on 05/29/11 Patient to be transferred to facility by   The following physician request were entered in Epic:   Additional Comments: CSW attempted to received [facility] number as sister requested [place] SNF's but [facility]  denied patient. CSW made referrals in [place] and they do not require [facility] number.

## 2011-05-29 NOTE — Progress Notes (Signed)
Agree with PTA discharge treatment note.  [place], [place] DPT [phone]

## 2011-05-29 NOTE — Progress Notes (Signed)
Pt sister has yet to arrive to transport patient. RN attempted to contact Ms. Meredeth Ide. No answer at this time, a message was left to call so RN knew that family was aware pt was being d/c.

## 2012-03-16 ENCOUNTER — Encounter (HOSPITAL_COMMUNITY): Payer: Self-pay | Admitting: Emergency Medicine

## 2012-03-16 ENCOUNTER — Emergency Department (HOSPITAL_COMMUNITY): Payer: Medicare Other

## 2012-03-16 ENCOUNTER — Inpatient Hospital Stay (HOSPITAL_COMMUNITY)
Admission: EM | Admit: 2012-03-16 | Discharge: 2012-03-18 | DRG: 682 | Disposition: A | Discharge status: Home / Self Care | Payer: Medicare Other | Attending: Internal Medicine | Admitting: Internal Medicine

## 2012-03-16 DIAGNOSIS — Z79899 Other long term (current) drug therapy: Secondary | ICD-10-CM

## 2012-03-16 DIAGNOSIS — E079 Disorder of thyroid, unspecified: Secondary | ICD-10-CM | POA: Diagnosis present

## 2012-03-16 DIAGNOSIS — K219 Gastro-esophageal reflux disease without esophagitis: Secondary | ICD-10-CM | POA: Diagnosis present

## 2012-03-16 DIAGNOSIS — E039 Hypothyroidism, unspecified: Secondary | ICD-10-CM | POA: Diagnosis present

## 2012-03-16 DIAGNOSIS — G9341 Metabolic encephalopathy: Secondary | ICD-10-CM | POA: Diagnosis present

## 2012-03-16 DIAGNOSIS — I498 Other specified cardiac arrhythmias: Secondary | ICD-10-CM

## 2012-03-16 DIAGNOSIS — I10 Essential (primary) hypertension: Secondary | ICD-10-CM | POA: Diagnosis present

## 2012-03-16 DIAGNOSIS — G40909 Epilepsy, unspecified, not intractable, without status epilepticus: Secondary | ICD-10-CM | POA: Diagnosis present

## 2012-03-16 DIAGNOSIS — N390 Urinary tract infection, site not specified: Secondary | ICD-10-CM | POA: Diagnosis present

## 2012-03-16 DIAGNOSIS — F17200 Nicotine dependence, unspecified, uncomplicated: Secondary | ICD-10-CM | POA: Diagnosis present

## 2012-03-16 DIAGNOSIS — F411 Generalized anxiety disorder: Secondary | ICD-10-CM | POA: Diagnosis present

## 2012-03-16 DIAGNOSIS — R001 Bradycardia, unspecified: Secondary | ICD-10-CM

## 2012-03-16 DIAGNOSIS — F3289 Other specified depressive episodes: Secondary | ICD-10-CM | POA: Diagnosis present

## 2012-03-16 DIAGNOSIS — R569 Unspecified convulsions: Secondary | ICD-10-CM | POA: Diagnosis present

## 2012-03-16 DIAGNOSIS — R4182 Altered mental status, unspecified: Secondary | ICD-10-CM

## 2012-03-16 DIAGNOSIS — F429 Obsessive-compulsive disorder, unspecified: Secondary | ICD-10-CM | POA: Diagnosis present

## 2012-03-16 DIAGNOSIS — E871 Hypo-osmolality and hyponatremia: Secondary | ICD-10-CM | POA: Diagnosis present

## 2012-03-16 DIAGNOSIS — M129 Arthropathy, unspecified: Secondary | ICD-10-CM | POA: Diagnosis present

## 2012-03-16 DIAGNOSIS — N179 Acute kidney failure, unspecified: Principal | ICD-10-CM | POA: Diagnosis present

## 2012-03-16 DIAGNOSIS — E876 Hypokalemia: Secondary | ICD-10-CM | POA: Diagnosis present

## 2012-03-16 DIAGNOSIS — T426X1A Poisoning by other antiepileptic and sedative-hypnotic drugs, accidental (unintentional), initial encounter: Secondary | ICD-10-CM

## 2012-03-16 DIAGNOSIS — F329 Major depressive disorder, single episode, unspecified: Secondary | ICD-10-CM | POA: Diagnosis present

## 2012-03-16 LAB — CBC
HCT: 31.4 % — ABNORMAL LOW (ref 36.0–46.0)
Hemoglobin: 11.3 g/dL — ABNORMAL LOW (ref 12.0–15.0)
MCH: 32.7 pg (ref 26.0–34.0)
MCHC: 36 g/dL (ref 30.0–36.0)
MCV: 90.8 fL (ref 78.0–100.0)
Platelets: 211 10*3/uL (ref 150–400)
Platelets: 199 K/uL (ref 150–400)
RBC: 3.53 MIL/uL — ABNORMAL LOW (ref 3.87–5.11)
RBC: 3.46 MIL/uL — ABNORMAL LOW (ref 3.87–5.11)
RDW: 13.3 % (ref 11.5–15.5)
WBC: 9.6 10*3/uL (ref 4.0–10.5)
WBC: 8.3 K/uL (ref 4.0–10.5)

## 2012-03-16 LAB — DIFFERENTIAL
Basophils Absolute: 0 10*3/uL (ref 0.0–0.1)
Lymphocytes Relative: 26 % (ref 12–46)
Lymphs Abs: 2.5 10*3/uL (ref 0.7–4.0)
Neutrophils Relative %: 59 % (ref 43–77)

## 2012-03-16 LAB — AMMONIA: Ammonia: 20 umol/L (ref 11–60)

## 2012-03-16 LAB — URINALYSIS, ROUTINE W REFLEX MICROSCOPIC
Glucose, UA: NEGATIVE mg/dL
Glucose, UA: NEGATIVE mg/dL
Ketones, ur: NEGATIVE mg/dL
Leukocytes, UA: NEGATIVE
Nitrite: NEGATIVE
Protein, ur: NEGATIVE mg/dL
Protein, ur: NEGATIVE mg/dL

## 2012-03-16 LAB — PROTIME-INR
INR: 0.96 (ref 0.00–1.49)
Prothrombin Time: 12.7 seconds (ref 11.6–15.2)

## 2012-03-16 LAB — COMPREHENSIVE METABOLIC PANEL
ALT: 12 U/L (ref 0–35)
AST: 25 U/L (ref 0–37)
Alkaline Phosphatase: 58 U/L (ref 39–117)
CO2: 29 mEq/L (ref 19–32)
GFR calc Af Amer: 71 mL/min — ABNORMAL LOW (ref >90)
GFR calc non Af Amer: 61 mL/min — ABNORMAL LOW (ref >90)
Glucose, Bld: 81 mg/dL (ref 70–99)
Potassium: 3.5 mEq/L (ref 3.5–5.1)
Sodium: 128 mEq/L — ABNORMAL LOW (ref 135–145)
Total Protein: 6.8 g/dL (ref 6.0–8.3)

## 2012-03-16 LAB — MRSA PCR SCREENING: MRSA by PCR: POSITIVE — AB

## 2012-03-16 LAB — GLUCOSE, CAPILLARY

## 2012-03-16 LAB — POCT I-STAT, CHEM 8
Calcium, Ion: 1.16 mmol/L (ref 1.13–1.30)
Creatinine, Ser: 1.2 mg/dL — ABNORMAL HIGH (ref 0.50–1.10)
Glucose, Bld: 82 mg/dL (ref 70–99)
HCT: 35 % — ABNORMAL LOW (ref 36.0–46.0)
Hemoglobin: 11.9 g/dL — ABNORMAL LOW (ref 12.0–15.0)
Potassium: 3.5 mEq/L (ref 3.5–5.1)
TCO2: 29 mmol/L (ref 0–100)

## 2012-03-16 LAB — CREATININE, SERUM
Creatinine, Ser: 0.72 mg/dL (ref 0.50–1.10)
GFR calc non Af Amer: 85 mL/min — ABNORMAL LOW (ref >90)

## 2012-03-16 LAB — APTT: aPTT: 33 seconds (ref 24–37)

## 2012-03-16 LAB — URINE MICROSCOPIC-ADD ON

## 2012-03-16 LAB — RAPID URINE DRUG SCREEN, HOSP PERFORMED
Amphetamines: NOT DETECTED
Barbiturates: NOT DETECTED

## 2012-03-16 LAB — VALPROIC ACID LEVEL: Valproic Acid Lvl: 124.6 ug/mL — ABNORMAL HIGH (ref 50.0–100.0)

## 2012-03-16 MED ORDER — LEVOTHYROXINE SODIUM 25 MCG PO TABS
25.0000 ug | ORAL_TABLET | Freq: Every day | ORAL | Status: DC
  Administered 2012-03-17 – 2012-03-18 (×2): 25 ug via ORAL
  Filled 2012-03-16 (×3): qty 1

## 2012-03-16 MED ORDER — DEXTROSE 5 % IV SOLN
1.0000 g | INTRAVENOUS | Status: DC
  Administered 2012-03-16 – 2012-03-17 (×2): 1 g via INTRAVENOUS
  Filled 2012-03-16 (×4): qty 10

## 2012-03-16 MED ORDER — POLYETHYLENE GLYCOL 3350 17 G PO PACK
17.0000 g | PACK | Freq: Every day | ORAL | Status: DC | PRN
  Filled 2012-03-16: qty 1

## 2012-03-16 MED ORDER — VITAMIN B-12 500 MCG PO TABS: 500.0000 ug | ORAL_TABLET | Freq: Every day | ORAL | Status: DC

## 2012-03-16 MED ORDER — SODIUM CHLORIDE 0.9 % IV SOLN: INTRAVENOUS | Status: AC

## 2012-03-16 MED ORDER — SERTRALINE HCL 50 MG PO TABS
50.0000 mg | ORAL_TABLET | Freq: Every day | ORAL | Status: DC
  Administered 2012-03-17 – 2012-03-18 (×2): 50 mg via ORAL
  Filled 2012-03-16 (×2): qty 1

## 2012-03-16 MED ORDER — OXYCODONE HCL 5 MG PO TABS
5.0000 mg | ORAL_TABLET | Freq: Three times a day (TID) | ORAL | Status: DC | PRN
  Administered 2012-03-17 – 2012-03-18 (×4): 5 mg via ORAL
  Filled 2012-03-16 (×4): qty 1

## 2012-03-16 MED ORDER — ALPRAZOLAM 0.5 MG PO TABS
1.0000 mg | ORAL_TABLET | Freq: Three times a day (TID) | ORAL | Status: DC | PRN
  Administered 2012-03-17 – 2012-03-18 (×3): 1 mg via ORAL
  Filled 2012-03-16: qty 2
  Filled 2012-03-16: qty 1
  Filled 2012-03-16: qty 2
  Filled 2012-03-16: qty 1
  Filled 2012-03-16: qty 2

## 2012-03-16 MED ORDER — ACETAMINOPHEN 650 MG RE SUPP: 650.0000 mg | Freq: Four times a day (QID) | RECTAL | Status: DC | PRN

## 2012-03-16 MED ORDER — INFLUENZA VIRUS VACC SPLIT PF IM SUSP
0.5000 mL | INTRAMUSCULAR | Status: AC
  Administered 2012-03-17: 0.5 mL via INTRAMUSCULAR
  Filled 2012-03-16: qty 0.5

## 2012-03-16 MED ORDER — HYDROXYCHLOROQUINE SULFATE 200 MG PO TABS
400.0000 mg | ORAL_TABLET | Freq: Every day | ORAL | Status: DC
  Administered 2012-03-17 – 2012-03-18 (×2): 400 mg via ORAL
  Filled 2012-03-16 (×2): qty 2

## 2012-03-16 MED ORDER — HEPARIN SODIUM (PORCINE) 5000 UNIT/ML IJ SOLN
5000.0000 [IU] | Freq: Three times a day (TID) | INTRAMUSCULAR | Status: DC
  Administered 2012-03-16 – 2012-03-18 (×5): 5000 [IU] via SUBCUTANEOUS
  Filled 2012-03-16 (×8): qty 1

## 2012-03-16 MED ORDER — ONDANSETRON HCL 4 MG/2ML IJ SOLN: 4.0000 mg | Freq: Three times a day (TID) | INTRAMUSCULAR | Status: DC | PRN

## 2012-03-16 MED ORDER — CYANOCOBALAMIN 500 MCG PO TABS
500.0000 ug | ORAL_TABLET | Freq: Every day | ORAL | Status: DC
  Administered 2012-03-17 – 2012-03-18 (×2): 500 ug via ORAL
  Filled 2012-03-16 (×2): qty 1

## 2012-03-16 MED ORDER — FLUVOXAMINE MALEATE 100 MG PO TABS
100.0000 mg | ORAL_TABLET | Freq: Two times a day (BID) | ORAL | Status: DC
  Administered 2012-03-16 – 2012-03-18 (×4): 100 mg via ORAL
  Filled 2012-03-16 (×6): qty 1

## 2012-03-16 MED ORDER — SODIUM CHLORIDE 0.9 % IV SOLN
INTRAVENOUS | Status: DC
  Administered 2012-03-16 (×2): via INTRAVENOUS

## 2012-03-16 MED ORDER — SODIUM CHLORIDE 0.9 % IV SOLN
Freq: Once | INTRAVENOUS | Status: AC
  Administered 2012-03-16: 16:00:00 via INTRAVENOUS

## 2012-03-16 MED ORDER — ONDANSETRON HCL 4 MG/2ML IJ SOLN: 4.0000 mg | Freq: Four times a day (QID) | INTRAMUSCULAR | Status: DC | PRN

## 2012-03-16 MED ORDER — MIRTAZAPINE 15 MG PO TABS
15.0000 mg | ORAL_TABLET | Freq: Every day | ORAL | Status: DC
  Administered 2012-03-16 – 2012-03-17 (×2): 15 mg via ORAL
  Filled 2012-03-16 (×3): qty 1

## 2012-03-16 MED ORDER — METOPROLOL TARTRATE 50 MG PO TABS
50.0000 mg | ORAL_TABLET | Freq: Every day | ORAL | Status: DC
  Administered 2012-03-17 – 2012-03-18 (×2): 50 mg via ORAL
  Filled 2012-03-16 (×2): qty 1

## 2012-03-16 MED ORDER — ACETAMINOPHEN 325 MG PO TABS
650.0000 mg | ORAL_TABLET | Freq: Four times a day (QID) | ORAL | Status: DC | PRN
  Administered 2012-03-18: 650 mg via ORAL
  Filled 2012-03-16: qty 2

## 2012-03-16 MED ORDER — ONDANSETRON HCL 4 MG PO TABS: 4.0000 mg | ORAL_TABLET | Freq: Four times a day (QID) | ORAL | Status: DC | PRN

## 2012-03-16 NOTE — ED Notes (Signed)
Pt c/o left sided dullness.

## 2012-03-16 NOTE — Consult Note (Addendum)
NEURO HOSPITALIST CONSULT NOTE    Reason for Consult: Confusion and slurred speech.  HPI:                                                                                                                                          Amber Pierce is an 72 y.o. female history of hypertension, hypothyroidism, seizure disorder, depression and anxiety, was brought to the emergency room for evaluation of confusion and slurred speech. Time of onset is unclear. She apparently was well about 9:00 last night. When her sisters are again this morning between 9 and 10 AM she was noted to have somewhat slurred speech and to be confused. No focal weakness was noted. CT scan of her head was obtained which showed no acute intracranial abnormality. Code stroke was activated but subsequently canceled. NIH stroke score was possible 3, with no clear objective deficit determined and deliberate embellishment of responses at times which was subsequently corrected. Patient showed no indications of recurrence of partial seizure activity.  Past Medical History  Diagnosis Date  . Hypertension   . Thyroid disease   . Seizures   . Arthritis   . GERD (gastroesophageal reflux disease)   . Anxiety     No past surgical history on file.  No family history on file.  Social History:  reports that she has been smoking.  She does not have any smokeless tobacco history on file. She reports that she does not drink alcohol. Her drug history is not on file.  No Known Allergies  MEDICATIONS:                                                                                                                     Prior to Admission: Norvasc 10 mg per day Multivitamin 1 per day Depakote 500 mg per day Luvox 100 mg twice a day Plaquenil 400 mg per day Synthroid 25 mcg per day Lopressor 50 mg per day Remeron 15 mg at bedtime Zoloft 50 mg per day Vitamin B12 500 mcg per day   Pulse 52, SpO2 99.00%.  Neurologic  Examination:  Mental Status: Alert, oriented x3.  Speech fluent without evidence of aphasia.  Cranial Nerves: II-inconsistent responses to visual field testing which were unreliable. III/IV/VI-Pupils were equal and reacted. Extraocular movements were full and conjugate.    V/VII-no facial numbness and no facial weakness. VIII-normal. X-normal speech and symmetrical palatal movement. XII-midline tongue extension Motor: 5/5 bilaterally with normal tone and bulk Sensory: Normal throughout. Deep Tendon Reflexes: 1+ and symmetric. Plantars: Mute bilaterally Cerebellar: Normal finger-to-nose testing.  No results found for this basename: cbc, bmp, coags, chol, tri, ldl, hga1c    Results for orders placed during the hospital encounter of 03/16/12 (from the past 48 hour(s))  GLUCOSE, CAPILLARY     Status: None   Collection Time    03/16/12  2:08 PM      Result Value Range   Glucose-Capillary 73  70 - 99 mg/dL  PROTIME-INR     Status: None   Collection Time    03/16/12  2:27 PM      Result Value Range   Prothrombin Time 12.7  11.6 - 15.2 seconds   INR 0.96  0.00 - 1.49  APTT     Status: None   Collection Time    03/16/12  2:27 PM      Result Value Range   aPTT 33  24 - 37 seconds  CBC     Status: Abnormal   Collection Time    03/16/12  2:27 PM      Result Value Range   WBC 9.6  4.0 - 10.5 K/uL   RBC 3.53 (*) 3.87 - 5.11 MIL/uL   Hemoglobin 11.6 (*) 12.0 - 15.0 g/dL   HCT 82.9 (*) 56.2 - 13.0 %   MCV 90.9  78.0 - 100.0 fL   MCH 32.9  26.0 - 34.0 pg   MCHC 36.1 (*) 30.0 - 36.0 g/dL   RDW 86.5  78.4 - 69.6 %   Platelets 211  150 - 400 K/uL  DIFFERENTIAL     Status: Abnormal   Collection Time    03/16/12  2:27 PM      Result Value Range   Neutrophils Relative 59  43 - 77 %   Neutro Abs 5.7  1.7 - 7.7 K/uL   Lymphocytes Relative 26  12 - 46 %   Lymphs Abs 2.5  0.7 - 4.0 K/uL    Monocytes Relative 12  3 - 12 %   Monocytes Absolute 1.2 (*) 0.1 - 1.0 K/uL   Eosinophils Relative 2  0 - 5 %   Eosinophils Absolute 0.2  0.0 - 0.7 K/uL   Basophils Relative 0  0 - 1 %   Basophils Absolute 0.0  0.0 - 0.1 K/uL  POCT I-STAT TROPONIN I     Status: None   Collection Time    03/16/12  2:38 PM      Result Value Range   Troponin i, poc 0.01  0.00 - 0.08 ng/mL   Comment 3            Comment: Due to the release kinetics of cTnI,     a negative result within the first hours     of the onset of symptoms does not rule out     myocardial infarction with certainty.     If myocardial infarction is still suspected,     repeat the test at appropriate intervals.  POCT I-STAT, CHEM 8     Status: Abnormal   Collection Time    03/16/12  2:41 PM      Result Value Range   Sodium 128 (*) 135 - 145 mEq/L   Potassium 3.5  3.5 - 5.1 mEq/L   Chloride 91 (*) 96 - 112 mEq/L   BUN 12  6 - 23 mg/dL   Creatinine, Ser 4.69 (*) 0.50 - 1.10 mg/dL   Glucose, Bld 82  70 - 99 mg/dL   Calcium, Ion 6.29  5.28 - 1.30 mmol/L   TCO2 29  0 - 100 mmol/L   Hemoglobin 11.9 (*) 12.0 - 15.0 g/dL   HCT 41.3 (*) 24.4 - 01.0 %    Ct Head Wo Contrast  03/16/2012  *RADIOLOGY REPORT*  Clinical Data: Code stroke.  Altered mental status  CT HEAD WITHOUT CONTRAST  Technique:  Contiguous axial images were obtained from the base of the skull through the vertex without contrast.  Comparison: CT head 05/21/2011  Findings: Mild atrophy.  There is patchy hypodensity in the cerebral white matter, similar to the prior study and consistent with chronic microvascular ischemia.  Negative for acute infarct. Negative for hemorrhage or mass.  Atherosclerotic calcification is present.  Calvarium is intact. Bilateral lens implants.  IMPRESSION: Atrophy and chronic microvascular ischemia.  No acute abnormality.  Critical Value/emergent results were called by telephone at the time of interpretation on 03/16/2012 at 1457 hours to Dr.  Rhunette Croft, who verbally acknowledged these results.   Original Report Authenticated By: Janeece Riggers, M.D.    Assessment/Plan: Altered mental status of unclear etiology. Patient has no signs of acute stroke nor any indications of TIA based on description of symptoms demonstrated earlier today. No objective focal abnormality was noted on examination. Patient's speech is back to normal at this point.  Recommend continued evaluation to rule out possible metabolic or infectious etiology for apparent mental status changes. Agree with obtaining Depakote level as well, although description of changes earlier this morning were not consistent with patient's observed seizure manifestations. No further neurodiagnostic studies are indicated if Depakote level is within therapeutic range (50.0 to 100.0).  Venetia Maxon M.D. [facility] [phone]  03/16/2012, 3:06 PM

## 2012-03-16 NOTE — ED Notes (Addendum)
Attempted to give report to 3W. Put on hold for 10 mins. Will call back later.

## 2012-03-16 NOTE — ED Notes (Signed)
Code Stroke was cancelled.

## 2012-03-16 NOTE — ED Notes (Signed)
Pt's daughter reports the pt wakes up early and gets up does her own thing. Pt came to sister kitchen at 0900am this morning, pt didn't have an appetite, sister describes her as appearing "druged/drowsy" and the pt stated back to her that she didn't take anything more than what she was supposed to. sts she had dinner with family last night and she was acting her normal self.

## 2012-03-16 NOTE — ED Notes (Signed)
EKG completed in triage and signed. It was brought back with the patient but it was not checked off.

## 2012-03-16 NOTE — Progress Notes (Signed)
Got a call from poison control center at [phone] (spoke with [place]). She called to check on the status of the pt due to her earlier elevated valproic acid level,pt oriented x3, abit drowsy but easily arousable,follows command and denies any discomfort.She wanted to know if a repeat valproic level was drawn on the pt,non was actually ordered.NP M. Lynch(on call) notified,said will look into it. Pt put on contact isolation for positive nasal MRSA screen, will however continue to monitor. [place]-As[place], Amber Pierce

## 2012-03-16 NOTE — ED Notes (Signed)
Stroke team at bedside to evaluate patient.

## 2012-03-16 NOTE — ED Notes (Signed)
Pt's sister reports pt was very confused this morning thinking there were people at her house that hadn't been there in a long time and confused about the day and thought she was supposed to go to a doctor appointment, later she noticed her speech was different and thought she was weaker than normal.

## 2012-03-16 NOTE — H&P (Addendum)
[facility] History and Physical  Amber Pierce OZH:[id number] DOB: July 22, 1940 DOA: 03/16/2012  Referring physician: Dr. Lorenda Hatchet PCP: No primary provider on file.  Specialists: Dr. Roseanne Reno consulted by EDP  Chief Complaint: altered mental status  HPI: Amber Pierce is a 72 y.o. female  Past medical history of partial seizures, hypothyroidism depression anxiety disorder that comes in for slurred speech as per patient's family. She apparently was okay and 9 AM and a sister saw her again about 11 AM she had slurred speech was walking more slowly and seemed more confused. She had no focal weakness. A CT scan of the head which done in the emergency room that showed no acute intracranial abnormalities. A code stroke was called which was later canceled by the neurologist that she had no clear objective deficits. Further evaluation the patient has also been complaining that her urine sitting smelling bad, she relates no incontinent. So we were asked to admit and further evaluate for metabolic encephalopathy. Her Depakote level was drawn which was borderline high. Denies any fever chills cough shortness of breath nausea vomiting or diarrhea.  A basic metabolic panel show an increase in her creatinine and with some hyponatremia and hypochloremia.  Review of Systems: The patient denies anorexia, fever, weight loss,, vision loss, decreased hearing, hoarseness, chest pain, syncope, dyspnea on exertion, peripheral edema, balance deficits, hemoptysis, abdominal pain, melena, hematochezia, severe indigestion/heartburn, hematuria,  genital sores, muscle weakness, suspicious skin lesions, transient blindness, difficulty walking, depression, unusual weight change, abnormal bleeding, enlarged lymph nodes, angioedema, and breast masses.    Past Medical History  Diagnosis Date  . Hypertension   . Thyroid disease   . Seizures   . Arthritis   . GERD (gastroesophageal reflux disease)   . Anxiety     History reviewed. No pertinent past surgical history. Social History:  reports that she has been smoking Cigarettes.  She has been smoking about 0.50 packs per day. She does not have any smokeless tobacco history on file. She reports that she does not drink alcohol. Her drug history is not on file. Lives at home with family  No Known Allergies  Family History  Problem Relation Age of Onset  . Heart attack Father      Prior to Admission medications   Medication Sig Start Date End Date Taking? Authorizing Provider  ALPRAZolam Prudy Feeler) 1 MG tablet Take 1 mg by mouth 3 (three) times daily as needed for sleep. For anxiety   Yes Historical Provider, MD  amLODipine (NORVASC) 10 MG tablet Take 1 tablet (10 mg total) by mouth daily. 05/28/11 05/27/12 Yes Rhetta Mura, MD  B Complex-C-Min-Fe-FA (HEMATINIC PLUS VIT/MINERALS PO) Take 1 tablet by mouth daily.   Yes Historical Provider, MD  diphenoxylate-atropine (LOMOTIL) 2.5-0.025 MG per tablet Take 1 tablet by mouth 4 (four) times daily as needed for diarrhea or loose stools. For diarrhea   Yes Historical Provider, MD  divalproex (DEPAKOTE) 500 MG DR tablet Take 500 mg by mouth daily.   Yes Historical Provider, MD  hydroxychloroquine (PLAQUENIL) 200 MG tablet Take 400 mg by mouth daily.   Yes Historical Provider, MD  levothyroxine (SYNTHROID, LEVOTHROID) 25 MCG tablet Take 25 mcg by mouth daily.   Yes Historical Provider, MD  metoprolol (LOPRESSOR) 50 MG tablet Take 50 mg by mouth daily.   Yes Historical Provider, MD  mirtazapine (REMERON) 15 MG tablet Take 15 mg by mouth at bedtime.   Yes Historical Provider, MD  oxyCODONE (OXY IR/ROXICODONE) 5 MG immediate release  tablet Take 5 mg by mouth every 8 (eight) hours as needed for pain. For pain   Yes Historical Provider, MD  potassium chloride SA (K-DUR,KLOR-CON) 20 MEQ tablet Take 20 mEq by mouth daily.   Yes Historical Provider, MD  sertraline (ZOLOFT) 50 MG tablet Take 50 mg by mouth daily.    Yes Historical Provider, MD  vitamin B-12 (CYANOCOBALAMIN) 500 MCG tablet Take 500 mcg by mouth daily.   Yes Historical Provider, MD  fluvoxaMINE (LUVOX) 100 MG tablet Take 1 tablet (100 mg total) by mouth 2 (two) times daily. 05/28/11 06/27/11  Rhetta Mura, MD   Physical Exam: Filed Vitals:   03/16/12 1535 03/16/12 1545 03/16/12 1600 03/16/12 1601  BP:  139/63 132/61 132/61  Pulse:  49 50   Temp: 97.8 F (36.6 C)     Resp:  16 18 18   SpO2:  98% 98% 98%     General: Awake alert and oriented x3   Eyes: Anicteric no pallor  ENT: Dry mucous membranes  Neck: No JVD  Cardiovascular: Regular rate and rhythm bradycardic with positive S1 and S2  Respiratory: Good air movement clear to auscultation  Abdomen:  Positive bowel sounds suprapubic tenderness in the suprapubic area non distended soft  Skin: No rashes or ulcerations  Musculoskeletal: Intact  Psychiatric: Appropriate  Neurologic: Awake alert and oriented x3 coherent for language , she is slightly slow to respond to questions. 3-12 are grossly intact sensation is intact throughout muscle strength is 5 over 5 in all 4 extremities deep tendon reflexes 2+ bilaterally finger-to-nose is normal the  Labs on Admission:  Basic Metabolic Panel:  Recent Labs Lab 03/16/12 1427 03/16/12 1441  NA 128* 128*  K 3.5 3.5  CL 88* 91*  CO2 29  --   GLUCOSE 81 82  BUN 12 12  CREATININE 0.93 1.20*  CALCIUM 9.2  --    Liver Function Tests:  Recent Labs Lab 03/16/12 1427  AST 25  ALT 12  ALKPHOS 58  BILITOT 0.5  PROT 6.8  ALBUMIN 3.2*   No results found for this basename: LIPASE, AMYLASE,  in the last 168 hours No results found for this basename: AMMONIA,  in the last 168 hours CBC:  Recent Labs Lab 03/16/12 1427 03/16/12 1441  WBC 9.6  --   NEUTROABS 5.7  --   HGB 11.6* 11.9*  HCT 32.1* 35.0*  MCV 90.9  --   PLT 211  --    Cardiac Enzymes: No results found for this basename: CKTOTAL, CKMB, CKMBINDEX,  TROPONINI,  in the last 168 hours  BNP (last 3 results) No results found for this basename: PROBNP,  in the last 8760 hours CBG:  Recent Labs Lab 03/16/12 1408  GLUCAP 73    Radiological Exams on Admission: Ct Head Wo Contrast  03/16/2012  *RADIOLOGY REPORT*  Clinical Data: Code stroke.  Altered mental status  CT HEAD WITHOUT CONTRAST  Technique:  Contiguous axial images were obtained from the base of the skull through the vertex without contrast.  Comparison: CT head 05/21/2011  Findings: Mild atrophy.  There is patchy hypodensity in the cerebral white matter, similar to the prior study and consistent with chronic microvascular ischemia.  Negative for acute infarct. Negative for hemorrhage or mass.  Atherosclerotic calcification is present.  Calvarium is intact. Bilateral lens implants.  IMPRESSION: Atrophy and chronic microvascular ischemia.  No acute abnormality.  Critical Value/emergent results were called by telephone at the time of interpretation on 03/16/2012 at 1457  hours to Dr. Rhunette Croft, who verbally acknowledged these results.   Original Report Authenticated By: Janeece Riggers, M.D.     EKG: Sinus bradycardia with T-wave flattening.  Assessment/Plan Metabolic encephalopathy/ AKI/Hyponatremia : - I will go ahead and get a urinalysis. As she is tender in the suprapubic area this will explain her acute metabolic encephalopathy as well as her acute kidney injury. She denies any CVA tenderness. She does relate that her urine has been smelling bad the past 2 or 3 days. I will check urine culture and urinalysis and start her on Rocephin empirically.  - She also has a mildly elevated Depakote which is probably secondary to dehydration. We'll go ahead and hold  it. Her finger-to-nose is normal and she has no nystagmus and physical exam, it is unlikely that her Depakote is contributing to her encephalopathy.. I will go ahead check an ammonia level. Her LFTs are within normal limits.  - Also in  the differential will be acute kidney injury, but her BUN is within normal. Her baseline creatinine is 0.9.  On 03/16/2012 is 1.2. I will go ahead and start her on IV fluids. Check strict I.'s and O.'s. And recheck a basic metabolic panel in the morning.  - And hold her Norvasc. And continue metoprolol. She is also on Xanax which might have accumulated secondary to her acute kidney injury.  - She also has a mildly decreased sodium at 128. It is unlikely that this is contributing to her symptoms. This most likely secondary to decreased intravascular volume. We'll go ahead and start her on IV fluids and check a basic metabolic panel in the morning.  OCD (obsessive compulsive disorder)/Partial seizure - ON admission Depakote once her Depakote levels within normal limits.  Hypertension: - I will go ahead and hold her blood pressure medications. We'll check orthostatics and start her on IV fluids. As her creatinine has returned to baseline the recent her blood pressure medication.   Bradycardia: - She is on a beta blocker and nitroglycerin her bradycardia. She has remained asymptomatic. We'll go ahead and continue her beta blocker.  Code Status: full Family Communication: daughter Disposition Plan: home 2-3 days  Time spent: 65 minutes  Marinda Elk [facility] Pager [phone]  If 7PM-7AM, please contact night-coverage [email or web address] Password [facility] 03/16/2012, 4:22 PM

## 2012-03-16 NOTE — ED Notes (Signed)
Pt daughter reports she is not responsive as normal today. Pt is alert but disoriented now and is bradycardic.

## 2012-03-16 NOTE — ED Provider Notes (Signed)
History     CSN: [id number]  Arrival date & time 03/16/12  1400   First MD Initiated Contact with Patient 03/16/12 1413      Chief Complaint  Patient presents with  . Code Stroke    (Consider location/radiation/quality/duration/timing/severity/associated sxs/prior treatment) HPI Comments: Patient presents with her sister with mental status change that started today. Her last seen normal is unclear sister states she noticed she was confused around 10:30 this morning. She was last seen normal sometime last night. She was disoriented and talking to people who weren't there. She is confused not following commands. She's not as talkative as her baseline . No history of fevers, chills, nausea or vomiting.  The history is provided by the patient and a relative. The history is limited by the condition of the patient.    Past Medical History  Diagnosis Date  . Hypertension   . Thyroid disease   . Seizures   . Arthritis   . GERD (gastroesophageal reflux disease)   . Anxiety     No past surgical history on file.  No family history on file.  History  Substance Use Topics  . Smoking status: Current Every Day Smoker  . Smokeless tobacco: Not on file  . Alcohol Use: No    OB History   Grav Para Term Preterm Abortions TAB SAB Ect Mult Living                  Review of Systems  Unable to perform ROS: Mental status change    Allergies  Review of patient's allergies indicates no known allergies.  Home Medications   Current Outpatient Rx  Name  Route  Sig  Dispense  Refill  . amLODipine (NORVASC) 10 MG tablet   Oral   Take 1 tablet (10 mg total) by mouth daily.   30 tablet   0   . B Complex-C-Min-Fe-FA (HEMATINIC PLUS VIT/MINERALS PO)   Oral   Take 1 tablet by mouth daily.         . divalproex (DEPAKOTE) 500 MG DR tablet   Oral   Take 500 mg by mouth daily.         Marland Kitchen EXPIRED: fluvoxaMINE (LUVOX) 100 MG tablet   Oral   Take 1 tablet (100 mg total) by mouth 2  (two) times daily.   60 tablet   0   . hydroxychloroquine (PLAQUENIL) 200 MG tablet   Oral   Take 400 mg by mouth daily.         Marland Kitchen levothyroxine (SYNTHROID, LEVOTHROID) 25 MCG tablet   Oral   Take 25 mcg by mouth daily.         . metoprolol (LOPRESSOR) 50 MG tablet   Oral   Take 50 mg by mouth daily.         . mirtazapine (REMERON) 15 MG tablet   Oral   Take 15 mg by mouth at bedtime.         . sertraline (ZOLOFT) 50 MG tablet   Oral   Take 50 mg by mouth daily.         . vitamin B-12 (CYANOCOBALAMIN) 500 MCG tablet   Oral   Take 500 mcg by mouth daily.           BP 116/50  Pulse 50  Resp 12  SpO2 99%  Physical Exam  Constitutional: She is oriented to person, place, and time. She appears well-developed and well-nourished. No distress.  HENT:  Head: Normocephalic  and atraumatic.  Mouth/Throat: Oropharynx is clear and moist. No oropharyngeal exudate.  Dry mucus membranes  Eyes: Conjunctivae are normal. Pupils are equal, round, and reactive to light.  Neck: Normal range of motion. Neck supple.  Cardiovascular: Normal rate, regular rhythm and normal heart sounds.   No murmur heard. Pulmonary/Chest: Effort normal and breath sounds normal. No respiratory distress.  Abdominal: Soft. There is no tenderness. There is no rebound and no guarding.  Musculoskeletal: Normal range of motion. She exhibits no edema and no tenderness.  Neurological: She is alert and oriented to person, place, and time. She exhibits normal muscle tone. Coordination normal.  Questionable left nasolabial fold flattening, tongue midline. Grip strengths appear to be equal. Not cooperative with commands. No pronator drift. Will not hold her legs off the bed.  Skin: Skin is warm.    ED Course  Procedures (including critical care time)  Labs Reviewed  CBC - Abnormal; Notable for the following:    RBC 3.53 (*)    Hemoglobin 11.6 (*)    HCT 32.1 (*)    MCHC 36.1 (*)    All other  components within normal limits  DIFFERENTIAL - Abnormal; Notable for the following:    Monocytes Absolute 1.2 (*)    All other components within normal limits  POCT I-STAT, CHEM 8 - Abnormal; Notable for the following:    Sodium 128 (*)    Chloride 91 (*)    Creatinine, Ser 1.20 (*)    Hemoglobin 11.9 (*)    HCT 35.0 (*)    All other components within normal limits  PROTIME-INR  APTT  GLUCOSE, CAPILLARY  ETHANOL  COMPREHENSIVE METABOLIC PANEL  TROPONIN I  URINE RAPID DRUG SCREEN (HOSP PERFORMED)  URINALYSIS, ROUTINE W REFLEX MICROSCOPIC  VALPROIC ACID LEVEL  POCT I-STAT TROPONIN I   Ct Head Wo Contrast  03/16/2012  *RADIOLOGY REPORT*  Clinical Data: Code stroke.  Altered mental status  CT HEAD WITHOUT CONTRAST  Technique:  Contiguous axial images were obtained from the base of the skull through the vertex without contrast.  Comparison: CT head 05/21/2011  Findings: Mild atrophy.  There is patchy hypodensity in the cerebral white matter, similar to the prior study and consistent with chronic microvascular ischemia.  Negative for acute infarct. Negative for hemorrhage or mass.  Atherosclerotic calcification is present.  Calvarium is intact. Bilateral lens implants.  IMPRESSION: Atrophy and chronic microvascular ischemia.  No acute abnormality.  Critical Value/emergent results were called by telephone at the time of interpretation on 03/16/2012 at 1457 hours to Dr. Rhunette Croft, who verbally acknowledged these results.   Original Report Authenticated By: Janeece Riggers, M.D.      No diagnosis found.    MDM  Acute mental status change for the past 5 hours. Unclear last seen normal. Code stroke called on arrival and assessment Dr. Roseanne Reno.  Her exam is inconsistent in difference between examiners. CT head negative for acute hemorrhage. Code stroke cancelled by Dr. Roseanne Reno.  Neurology feels her exam is more consistent with infectious or metabolic pathology. Her valproic acid level is elevated  at 124. D/w poison center who recommends supportive care for now as ammonia and LFTs are normal.  She appears mildly dehydrated clinically and has hyponatremia and ARF. Will start IVF.    Date: 03/16/2012  Rate: 48  Rhythm: sinus bradycardia  QRS Axis: normal  Intervals: normal  ST/T Wave abnormalities: nonspecific ST/T changes  Conduction Disutrbances:none  Narrative Interpretation: slow rate  Old EKG Reviewed: changes noted  Glynn Octave, MD 03/16/12 [phone]

## 2012-03-16 NOTE — ED Notes (Signed)
Pt back from radiology 

## 2012-03-17 LAB — COMPREHENSIVE METABOLIC PANEL
AST: 19 U/L (ref 0–37)
CO2: 25 mEq/L (ref 19–32)
Calcium: 8.8 mg/dL (ref 8.4–10.5)
Creatinine, Ser: 0.59 mg/dL (ref 0.50–1.10)
GFR calc Af Amer: >90 mL/min (ref >90)
GFR calc non Af Amer: >90 mL/min (ref >90)
Glucose, Bld: 80 mg/dL (ref 70–99)

## 2012-03-17 LAB — GLUCOSE, CAPILLARY: Glucose-Capillary: 93 mg/dL (ref 70–99)

## 2012-03-17 LAB — CBC
Hemoglobin: 10.9 g/dL — ABNORMAL LOW (ref 12.0–15.0)
RBC: 3.33 MIL/uL — ABNORMAL LOW (ref 3.87–5.11)
WBC: 6.8 10*3/uL (ref 4.0–10.5)

## 2012-03-17 LAB — BASIC METABOLIC PANEL
BUN: 9 mg/dL (ref 6–23)
CO2: 28 mEq/L (ref 19–32)
Chloride: 91 mEq/L — ABNORMAL LOW (ref 96–112)
Creatinine, Ser: 0.61 mg/dL (ref 0.50–1.10)

## 2012-03-17 LAB — TROPONIN I: Troponin I: <0.3 ng/mL (ref <0.30)

## 2012-03-17 MED ORDER — POTASSIUM CHLORIDE IN NACL 40-0.9 MEQ/L-% IV SOLN
INTRAVENOUS | Status: DC
  Administered 2012-03-17 – 2012-03-18 (×2): via INTRAVENOUS
  Filled 2012-03-17 (×5): qty 1000

## 2012-03-17 NOTE — Evaluation (Signed)
Occupational Therapy Evaluation Patient Details Name: Amber Pierce MRN: [id number] DOB: [birth year] Today's Date: 03/17/2012 Time: 4098-1191 OT Time Calculation (min): 17 min  OT Assessment / Plan / Recommendation Clinical Impression  Pt admitted with slurred speech and changes in ability to ambulate.  CT negative for acute abnormalities.  No family is available to provide info about pt's PLOF, but may have some cognitive issues at baseline.  Will follow acutely.  Pt will need 24 hour adequate assist at home.    OT Assessment  Patient needs continued OT Services    Follow Up Recommendations  Supervision/Assistance - 24 hour    Barriers to Discharge      Equipment Recommendations       Recommendations for Other Services    Frequency  Min 2X/week    Precautions / Restrictions Precautions Precautions: Fall Restrictions Weight Bearing Restrictions: No   Pertinent Vitals/Pain No pain.    ADL  Eating/Feeding: Set up Where Assessed - Eating/Feeding: Chair Grooming: Brushing hair;Wash/dry hands;Set up Where Assessed - Grooming: Supported sitting Upper Body Bathing: Minimal assistance Where Assessed - Upper Body Bathing: Unsupported sitting Lower Body Bathing: Minimal assistance Where Assessed - Lower Body Bathing: Unsupported sitting;Supported sit to stand Upper Body Dressing: Set up Where Assessed - Upper Body Dressing: Unsupported sitting Lower Body Dressing: Minimal assistance Where Assessed - Lower Body Dressing: Supported sit to stand Equipment Used: Gait belt;Rolling walker Transfers/Ambulation Related to ADLs: min assist ADL Comments: Pt with some dexterity issues related to arthritis.  Pt appears to have some baseline cognitive issues.    OT Diagnosis: Generalized weakness;Cognitive deficits  OT Problem List: Decreased activity tolerance;Impaired balance (sitting and/or standing);Decreased coordination;Decreased cognition;Decreased safety awareness;Impaired UE  functional use OT Treatment Interventions: Self-care/ADL training;DME and/or AE instruction;Patient/family education   OT Goals Acute Rehab OT Goals OT Goal Formulation: With patient Time For Goal Achievement: 03/31/12 Potential to Achieve Goals: Good ADL Goals Pt Will Perform Grooming: with supervision;Standing at sink ADL Goal: Grooming - Progress: Goal set today Pt Will Perform Upper Body Bathing: with supervision;Sitting at sink ADL Goal: Upper Body Bathing - Progress: Goal set today Pt Will Perform Lower Body Bathing: with supervision;Sitting at sink;Standing at sink ADL Goal: Lower Body Bathing - Progress: Goal set today Pt Will Perform Lower Body Dressing: with supervision;Sit to stand from bed ADL Goal: Lower Body Dressing - Progress: Goal set today Pt Will Transfer to Toilet: with supervision;Ambulation;with DME ADL Goal: Toilet Transfer - Progress: Goal set today Pt Will Perform Toileting - Clothing Manipulation: with supervision;Standing ADL Goal: Toileting - Clothing Manipulation - Progress: Goal set today Pt Will Perform Toileting - Hygiene: with modified independence;Sit to stand from 3-in-1/toilet ADL Goal: Toileting - Hygiene - Progress: Goal set today Pt Will Perform Tub/Shower Transfer: Tub transfer;with supervision;Ambulation;Shower seat with back ADL Goal: Tub/Shower Transfer - Progress: Goal set today  Visit Information  Last OT Received On: 03/17/12 Assistance Needed: +1 PT/OT Co-Evaluation/Treatment: Yes    Subjective Data  Subjective: "Take that food away, it smells awful."   Prior Functioning     Home Living Lives With: Family (sister) Available Help at Discharge: Family;Available 24 hours/day Type of Home: House Home Access: Level entry Home Layout: Two level;Able to live on main level with bedroom/bathroom Bathroom Shower/Tub: Engineer, manufacturing systems: Standard Bathroom Accessibility: Yes How Accessible: Accessible via walker Home  Adaptive Equipment: Shower chair with back;Walker - rolling Prior Function Level of Independence: Needs assistance Needs Assistance: Meal Prep Meal Prep: Moderate Able to Take Stairs?:  No Driving: No Vocation: Retired Musician: No difficulties Dominant Hand: Left         Vision/Perception Vision - History Baseline Vision: Wears glasses all the time Patient Visual Report: No change from baseline   Cognition  Cognition Overall Cognitive Status: Impaired Area of Impairment: Safety/judgement;Problem solving Arousal/Alertness: Awake/alert Orientation Level: Oriented X4 / Intact Behavior During Session: WFL for tasks performed Safety/Judgement: Decreased safety judgement for tasks assessed;Decreased awareness of safety precautions Safety/Judgement - Other Comments: pt with significant trunk flexion when performing peri-care post urination Problem Solving: delayed processing, increased time    Extremity/Trunk Assessment Right Upper Extremity Assessment RUE ROM/Strength/Tone: Deficits RUE ROM/Strength/Tone Deficits: arthritic changes RUE Coordination:  (mild deficits in hand from arthritis) Left Upper Extremity Assessment LUE ROM/Strength/Tone: Deficits LUE ROM/Strength/Tone Deficits: arthritic changes LUE Sensation: WFL - Light Touch LUE Coordination:  (mild deficit due to arthritis)  Trunk Assessment Trunk Assessment: Normal     Mobility Bed Mobility Bed Mobility: Not assessed Supine to Sit: 5: Supervision Details for Bed Mobility Assistance: safet technique Transfers Sit to Stand: 4: Min guard;With upper extremity assist;From bed Stand to Sit: 5: Supervision;With upper extremity assist;To chair/3-in-1 Details for Transfer Assistance: increased time, cautious     Exercise     Balance Balance Balance Assessed: Yes Dynamic Sitting Balance Dynamic Sitting - Balance Support: No upper extremity supported Dynamic Sitting - Level of Assistance: 5:  Stand by assistance Dynamic Sitting - Comments: pt with increased trunk flexion to perform peri-care, no LOB   End of Session OT - End of Session Activity Tolerance: Patient limited by fatigue Patient left: in chair;with call bell/phone within reach  GO     Evern Bio 03/17/2012, 10:32 AM [phone]

## 2012-03-17 NOTE — Progress Notes (Signed)
[facility] PROGRESS NOTE  Amber Pierce HYQ:[id number] DOB: 09-16-1940 DOA: 03/16/2012 PCP: No primary provider on file.  Assessment/Plan: Principal Problem:   Metabolic encephalopathy Active Problems:   OCD (obsessive compulsive disorder)   Partial seizure   Hyponatremia   AKI (acute kidney injury)   Hypertension     Metabolic encephalopathy/ AKI/Hyponatremia/hypokalemia   Due to hyponatremia/UTI/elevated valproic acid level Will recheck valproic acid level in the morning Check bmet, hyponatremia thought  to be secondary to dehydration, continue with IV fluids Replete K    Hypertension continue metoprolol  OCD (obsessive compulsive disorder)/Partial seizure  - ON admission Depakote level elevated, and resume Depakote once her Depakote levels within normal limits  UTI on Rocephin    Bradycardia:  - She is on a beta blocker and nitroglycerin her bradycardia. She has remained asymptomatic. We'll go ahead and continue her beta blocker.    Code Status: full Family Communication: family updated about patient's clinical progress Disposition Plan:  Physical and occupational therapy consultation    Brief narrative: 72 y.o. female  Past medical history of partial seizures, hypothyroidism depression anxiety disorder that comes in for slurred speech as per patient's family. She apparently was okay and 9 AM and a sister saw her again about 11 AM she had slurred speech was walking more slowly and seemed more confused. She had no focal weakness. A CT scan of the head which done in the emergency room that showed no acute intracranial abnormalities. A code stroke was called which was later canceled by the neurologist that she had no clear objective deficits. Further evaluation the patient has also been complaining that her urine sitting smelling bad, she relates no incontinent. So we were asked to admit and further evaluate for metabolic encephalopathy. Her Depakote level was  drawn which was borderline high. Denies any fever chills cough shortness of breath nausea vomiting or diarrhea.  A basic metabolic panel show an increase in her creatinine and with some hyponatremia and hypochloremia.   Consultants: Noel Christmas   Procedures:  None   Antibiotics:  rocephin  HPI/Subjective: Improved mental status morning, aware of her surroundings  Objective: Filed Vitals:   03/16/12 1958 03/16/12 2235 03/17/12 0159 03/17/12 0602  BP: 106/55 129/48 126/59 168/60  Pulse: 52 55 61 63  Temp:  98.2 F (36.8 C) 97.4 F (36.3 C) 97.4 F (36.3 C)  TempSrc:  Oral Oral Oral  Resp:  18 18 20   Height:      Weight:      SpO2:  96% 96% 98%    Intake/Output Summary (Last 24 hours) at 03/17/12 0841 Last data filed at 03/17/12 0602  Gross per 24 hour  Intake    170 ml  Output   1050 ml  Net   -880 ml    Exam:  General: Awake alert and oriented x3  Eyes: Anicteric no pallor  ENT: Dry mucous membranes  Neck: No JVD  Cardiovascular: Regular rate and rhythm bradycardic with positive S1 and S2  Respiratory: Good air movement clear to auscultation  Abdomen: Positive bowel sounds suprapubic tenderness in the suprapubic area non distended soft  Skin: No rashes or ulcerations  Musculoskeletal: Intact  Psychiatric: Appropriate  Neurologic: Awake alert and oriented x3 coherent for language , she is slightly slow to respond to questions. 3-12 are grossly intact sensation is intact throughout muscle strength is 5 over 5 in all 4 extremities deep tendon reflexes 2+ bilaterally finger-to-nose is normal the     Data  Reviewed: Basic Metabolic Panel:  Recent Labs Lab 03/16/12 1427 03/16/12 1441 03/16/12 1805  NA 128* 128*  --   K 3.5 3.5  --   CL 88* 91*  --   CO2 29  --   --   GLUCOSE 81 82  --   BUN 12 12  --   CREATININE 0.93 1.20* 0.72  CALCIUM 9.2  --   --     Liver Function Tests:  Recent Labs Lab 03/16/12 1427  AST 25  ALT 12  ALKPHOS 58   BILITOT 0.5  PROT 6.8  ALBUMIN 3.2*   No results found for this basename: LIPASE, AMYLASE,  in the last 168 hours  Recent Labs Lab 03/16/12 1545 03/16/12 1805  AMMONIA 20 25    CBC:  Recent Labs Lab 03/16/12 1427 03/16/12 1441 03/16/12 1805 03/17/12 0730  WBC 9.6  --  8.3 6.8  NEUTROABS 5.7  --   --   --   HGB 11.6* 11.9* 11.3* 10.9*  HCT 32.1* 35.0* 31.4* 30.3*  MCV 90.9  --  90.8 91.0  PLT 211  --  199 217    Cardiac Enzymes: No results found for this basename: CKTOTAL, CKMB, CKMBINDEX, TROPONINI,  in the last 168 hours BNP (last 3 results) No results found for this basename: PROBNP,  in the last 8760 hours   CBG:  Recent Labs Lab 03/16/12 1408 03/16/12 2240 03/17/12 0716  GLUCAP 73 92 83    Recent Results (from the past 240 hour(s))  MRSA PCR SCREENING     Status: Abnormal   Collection Time    03/16/12  6:37 PM      Result Value Range Status   MRSA by PCR POSITIVE (*) NEGATIVE Final   Comment:            The GeneXpert MRSA Assay (FDA     approved for NASAL specimens     only), is one component of a     comprehensive MRSA colonization     surveillance program. It is not     intended to diagnose MRSA     infection nor to guide or     monitor treatment for     MRSA infections.     RESULT CALLED TO, READ BACK BY AND VERIFIED WITH:     OBASOGIE,P RN 2032 03/16/12 MITCHELL,L     Studies: Ct Head Wo Contrast  03/16/2012  *RADIOLOGY REPORT*  Clinical Data: Code stroke.  Altered mental status  CT HEAD WITHOUT CONTRAST  Technique:  Contiguous axial images were obtained from the base of the skull through the vertex without contrast.  Comparison: CT head 05/21/2011  Findings: Mild atrophy.  There is patchy hypodensity in the cerebral white matter, similar to the prior study and consistent with chronic microvascular ischemia.  Negative for acute infarct. Negative for hemorrhage or mass.  Atherosclerotic calcification is present.  Calvarium is intact. Bilateral  lens implants.  IMPRESSION: Atrophy and chronic microvascular ischemia.  No acute abnormality.  Critical Value/emergent results were called by telephone at the time of interpretation on 03/16/2012 at 1457 hours to Dr. Rhunette Croft, who verbally acknowledged these results.   Original Report Authenticated By: Janeece Riggers, M.D.     Scheduled Meds: . cefTRIAXone (ROCEPHIN)  IV  1 g Intravenous Q24H  . vitamin B-12  500 mcg Oral Daily  . fluvoxaMINE  100 mg Oral BID  . heparin  5,000 Units Subcutaneous Q8H  . hydroxychloroquine  400 mg Oral Daily  .  influenza  inactive virus vaccine  0.5 mL Intramuscular Tomorrow-1000  . levothyroxine  25 mcg Oral Q breakfast  . metoprolol  50 mg Oral Daily  . mirtazapine  15 mg Oral QHS  . sertraline  50 mg Oral Daily   Continuous Infusions: . sodium chloride 100 mL/hr at 03/16/12 2017    Principal Problem:   Metabolic encephalopathy Active Problems:   OCD (obsessive compulsive disorder)   Partial seizure   Hyponatremia   AKI (acute kidney injury)   Hypertension    Time spent: 40 minutes   [facility]  [facility] Pager [phone]. If 8PM-8AM, please contact night-coverage at [email or web address], password [facility] 03/17/2012, 8:41 AM  LOS: 1 day

## 2012-03-17 NOTE — Evaluation (Signed)
Physical Therapy Evaluation Patient Details Name: Amber Pierce MRN: [id number] DOB: Apr 21, 1940 Today's Date: 03/17/2012 Time: 8295-6213 PT Time Calculation (min): 35 min  PT Assessment / Plan / Recommendation Clinical Impression  Pt is a 72 yo female with suspected metabolic encephalopathy functioning near baseline. Pt with quesitonable history but appears to be providing accurate PLOF report and is orientedx4. Questioning if pt may have beginnign stages of dementia. Pt to require 24/7 supervision upon d/c home due to cognitive deficits and mild balance impairments.Pt to benefit from use of RW to decrease fall risk however pt reports "I'll just get in my way."    PT Assessment  Patient needs continued PT services    Follow Up Recommendations  No PT follow up;Supervision/Assistance - 24 hour    Does the patient have the potential to tolerate intense rehabilitation      Barriers to Discharge None      Equipment Recommendations  None recommended by PT (pt has RW at home)    Recommendations for Other Services     Frequency Min 3X/week    Precautions / Restrictions Precautions Precautions: Fall Restrictions Weight Bearing Restrictions: No   Pertinent Vitals/Pain Pt denies pain      Mobility  Bed Mobility Bed Mobility: Supine to Sit Supine to Sit: 5: Supervision Details for Bed Mobility Assistance: safet technique Transfers Transfers: Sit to Stand;Stand to Sit Sit to Stand: 4: Min guard;With upper extremity assist;From bed Stand to Sit: 5: Supervision;With upper extremity assist;To chair/3-in-1 Details for Transfer Assistance: increased time, cautious Ambulation/Gait Ambulation/Gait Assistance: 4: Min guard Ambulation Distance (Feet): 20 Feet Assistive device: None Ambulation/Gait Assistance Details: also trialed RW for 20 feet. pt with improved fluidity of gait and improved ability to stand upright/increased trunk extension however pt prefers not to use it. Pt with  increased trunk flexion,increased base of support, and short shuffled steps. Pt mildlyunsteady without RW however now apparent LOB. Gait Pattern: Step-through pattern;Decreased stride length;Shuffle;Wide base of support;Trunk flexed Gait velocity: wfl for age Stairs: No    Exercises     PT Diagnosis: Difficulty walking;Generalized weakness  PT Problem List: Decreased balance;Decreased safety awareness;Decreased cognition;Decreased activity tolerance PT Treatment Interventions: Gait training;Functional mobility training;Therapeutic activities;Therapeutic exercise;Balance training   PT Goals Acute Rehab PT Goals PT Goal Formulation: With patient Time For Goal Achievement: 03/24/12 Potential to Achieve Goals: Good Pt will go Sit to Stand: Independently PT Goal: Sit to Stand - Progress: Goal set today Pt will Stand: Independently;3 - 5 min;with no upper extremity support PT Goal: Stand - Progress: Goal set today Pt will Ambulate: >150 feet;with modified independence;with least restrictive assistive device PT Goal: Ambulate - Progress: Goal set today Additional Goals Additional Goal #1: Pt to scrore > 19 on DGI to indicate decreased fall risk PT Goal: Additional Goal #1 - Progress: Goal set today  Visit Information  Last PT Received On: 03/17/12 Assistance Needed: +1    Subjective Data  Subjective: Pt received supine in bed sleeping, easily awaken.   Prior Functioning  Home Living Lives With: Family (sister) Available Help at Discharge: Family;Available 24 hours/day Type of Home: House Home Access: Level entry Home Layout: Two level;Able to live on main level with bedroom/bathroom Bathroom Shower/Tub: Engineer, manufacturing systems: Standard Bathroom Accessibility: Yes How Accessible: Accessible via walker Home Adaptive Equipment: Shower chair with back;Walker - rolling Prior Function Level of Independence: Needs assistance Needs Assistance: Meal Prep (sister does all the  cooking) Meal Prep: Moderate Able to Take Stairs?: No Driving: No  Vocation: Retired Musician: No difficulties (increased response time) Dominant Hand: Left    Cognition  Cognition Overall Cognitive Status: Impaired Area of Impairment: Safety/judgement;Problem solving Arousal/Alertness: Awake/alert Orientation Level: Oriented X4 / Intact Behavior During Session: WFL for tasks performed Safety/Judgement: Decreased safety judgement for tasks assessed;Decreased awareness of safety precautions Safety/Judgement - Other Comments: pt with significant trunk flexion when performing peri-care post urination Problem Solving: delayed processing, increased time    Extremity/Trunk Assessment Right Upper Extremity Assessment RUE ROM/Strength/Tone: Deficits (limited hand dexterity and strengh due to arthritis) RUE Coordination:  (mild deficits in hand from arthritis) Left Upper Extremity Assessment LUE ROM/Strength/Tone: Deficits (limited hand dexterity and strengh due to arthritis) LUE Sensation: WFL - Light Touch LUE Coordination:  (mild deficit due to arthritis) Right Lower Extremity Assessment RLE ROM/Strength/Tone: WFL for tasks assessed RLE Sensation:  (reports some tingling in R LE) Left Lower Extremity Assessment LLE ROM/Strength/Tone: WFL for tasks assessed Trunk Assessment Trunk Assessment: Normal   Balance Balance Balance Assessed: Yes Dynamic Sitting Balance Dynamic Sitting - Balance Support: No upper extremity supported Dynamic Sitting - Level of Assistance: 5: Stand by assistance Dynamic Sitting - Comments: pt with increased trunk flexion to perform peri-care, no LOB  End of Session PT - End of Session Activity Tolerance: Patient tolerated treatment well Patient left: in chair;with call bell/phone within reach Nurse Communication: Mobility status  GP     Marcene Brawn 03/17/2012, 9:36 AM  Lewis Shock, PT, DPT Pager #: [phone] Office #:  [phone]

## 2012-03-18 LAB — GLUCOSE, CAPILLARY: Glucose-Capillary: 88 mg/dL (ref 70–99)

## 2012-03-18 LAB — BASIC METABOLIC PANEL
CO2: 27 mEq/L (ref 19–32)
Glucose, Bld: 92 mg/dL (ref 70–99)
Potassium: 4 mEq/L (ref 3.5–5.1)
Sodium: 137 mEq/L (ref 135–145)

## 2012-03-18 MED ORDER — POLYETHYLENE GLYCOL 3350 17 G PO PACK
17.0000 g | PACK | Freq: Every day | ORAL | Status: DC
  Filled 2012-03-18: qty 1

## 2012-03-18 MED ORDER — ALPRAZOLAM 0.5 MG PO TABS: 1.0000 mg | ORAL_TABLET | Freq: Every evening | ORAL | Status: DC | PRN

## 2012-03-18 MED ORDER — POTASSIUM CHLORIDE CRYS ER 20 MEQ PO TBCR: 40.0000 meq | EXTENDED_RELEASE_TABLET | Freq: Every day | ORAL | Status: DC

## 2012-03-18 MED ORDER — CIPROFLOXACIN HCL 500 MG PO TABS: 500.0000 mg | ORAL_TABLET | Freq: Two times a day (BID) | ORAL | Status: AC

## 2012-03-18 NOTE — Discharge Summary (Signed)
Physician Discharge Summary  Devlin Mcveigh MRN: [id number] DOB/AGE: 03/28/1940 72 y.o.  PCP: No primary provider on file.   Admit date: 03/16/2012 Discharge date: 03/18/2012  Discharge Diagnoses:      Metabolic encephalopathy Active Problems:   OCD (obsessive compulsive disorder)   Partial seizure   Hyponatremia   AKI (acute kidney injury)   Hypertension     Medication List    TAKE these medications       ALPRAZolam 0.5 MG tablet  Commonly known as:  XANAX  Take 2 tablets (1 mg total) by mouth at bedtime as needed for sleep or anxiety. For anxiety     amLODipine 10 MG tablet  Commonly known as:  NORVASC  Take 1 tablet (10 mg total) by mouth daily.     ciprofloxacin 500 MG tablet  Commonly known as:  CIPRO  Take 1 tablet (500 mg total) by mouth 2 (two) times daily.     diphenoxylate-atropine 2.5-0.025 MG per tablet  Commonly known as:  LOMOTIL  Take 1 tablet by mouth 4 (four) times daily as needed for diarrhea or loose stools. For diarrhea     divalproex 500 MG DR tablet  Commonly known as:  DEPAKOTE  Take 500 mg by mouth daily.     fluvoxaMINE 100 MG tablet  Commonly known as:  LUVOX  Take 1 tablet (100 mg total) by mouth 2 (two) times daily.     HEMATINIC PLUS VIT/MINERALS PO  Take 1 tablet by mouth daily.     hydroxychloroquine 200 MG tablet  Commonly known as:  PLAQUENIL  Take 400 mg by mouth daily.     levothyroxine 25 MCG tablet  Commonly known as:  SYNTHROID, LEVOTHROID  Take 25 mcg by mouth daily.     metoprolol 50 MG tablet  Commonly known as:  LOPRESSOR  Take 50 mg by mouth daily.     mirtazapine 15 MG tablet  Commonly known as:  REMERON  Take 15 mg by mouth at bedtime.     oxyCODONE 5 MG immediate release tablet  Commonly known as:  Oxy IR/ROXICODONE  Take 5 mg by mouth every 8 (eight) hours as needed for pain. For pain     potassium chloride SA 20 MEQ tablet  Commonly known as:  K-DUR,KLOR-CON  Take 2 tablets (40 mEq total)  by mouth daily.     sertraline 50 MG tablet  Commonly known as:  ZOLOFT  Take 50 mg by mouth daily.     vitamin B-12 500 MCG tablet  Commonly known as:  CYANOCOBALAMIN  Take 500 mcg by mouth daily.        Discharge Condition: Stable   Disposition: Home with 24-hour care   Consults: None  Significant Diagnostic Studies: Ct Head Wo Contrast  03/16/2012  *RADIOLOGY REPORT*  Clinical Data: Code stroke.  Altered mental status  CT HEAD WITHOUT CONTRAST  Technique:  Contiguous axial images were obtained from the base of the skull through the vertex without contrast.  Comparison: CT head 05/21/2011  Findings: Mild atrophy.  There is patchy hypodensity in the cerebral white matter, similar to the prior study and consistent with chronic microvascular ischemia.  Negative for acute infarct. Negative for hemorrhage or mass.  Atherosclerotic calcification is present.  Calvarium is intact. Bilateral lens implants.  IMPRESSION: Atrophy and chronic microvascular ischemia.  No acute abnormality.  Critical Value/emergent results were called by telephone at the time of interpretation on 03/16/2012 at 1457 hours to Dr. Rhunette Croft,  who verbally acknowledged these results.   Original Report Authenticated By: Janeece Riggers, M.D.        Microbiology: Recent Results (from the past 240 hour(s))  MRSA PCR SCREENING     Status: Abnormal   Collection Time    03/16/12  6:37 PM      Result Value Range Status   MRSA by PCR POSITIVE (*) NEGATIVE Final   Comment:            The GeneXpert MRSA Assay (FDA     approved for NASAL specimens     only), is one component of a     comprehensive MRSA colonization     surveillance program. It is not     intended to diagnose MRSA     infection nor to guide or     monitor treatment for     MRSA infections.     RESULT CALLED TO, READ BACK BY AND VERIFIED WITH:     OBASOGIE,P RN 2032 03/16/12 MITCHELL,L     Labs: Results for orders placed during the hospital encounter of  03/16/12 (from the past 48 hour(s))  GLUCOSE, CAPILLARY     Status: None   Collection Time    03/16/12  2:08 PM      Result Value Range   Glucose-Capillary 73  70 - 99 mg/dL  ETHANOL     Status: None   Collection Time    03/16/12  2:27 PM      Result Value Range   Alcohol, Ethyl (B) <11  0 - 11 mg/dL   Comment:            LOWEST DETECTABLE LIMIT FOR     SERUM ALCOHOL IS 11 mg/dL     FOR MEDICAL PURPOSES ONLY  PROTIME-INR     Status: None   Collection Time    03/16/12  2:27 PM      Result Value Range   Prothrombin Time 12.Amber  11.6 - 15.2 seconds   INR 0.96  0.00 - 1.49  APTT     Status: None   Collection Time    03/16/12  2:27 PM      Result Value Range   aPTT 33  24 - 37 seconds  CBC     Status: Abnormal   Collection Time    03/16/12  2:27 PM      Result Value Range   WBC 9.6  4.0 - 10.5 K/uL   RBC 3.53 (*) 3.87 - 5.11 MIL/uL   Hemoglobin 11.6 (*) 12.0 - 15.0 g/dL   HCT 16.1 (*) 09.6 - 04.5 %   MCV 90.9  78.0 - 100.0 fL   MCH 32.9  26.0 - 34.0 pg   MCHC 36.1 (*) 30.0 - 36.0 g/dL   RDW 40.9  81.1 - 91.4 %   Platelets 211  150 - 400 K/uL  DIFFERENTIAL     Status: Abnormal   Collection Time    03/16/12  2:27 PM      Result Value Range   Neutrophils Relative 59  43 - 77 %   Neutro Abs 5.Amber  1.Amber - Amber.Amber K/uL   Lymphocytes Relative 26  12 - 46 %   Lymphs Abs 2.5  0.Amber - 4.0 K/uL   Monocytes Relative 12  3 - 12 %   Monocytes Absolute 1.2 (*) 0.1 - 1.0 K/uL   Eosinophils Relative 2  0 - 5 %   Eosinophils Absolute 0.2  0.0 - 0.Amber K/uL  Basophils Relative 0  0 - 1 %   Basophils Absolute 0.0  0.0 - 0.1 K/uL  COMPREHENSIVE METABOLIC PANEL     Status: Abnormal   Collection Time    03/16/12  2:27 PM      Result Value Range   Sodium 128 (*) 135 - 145 mEq/L   Potassium 3.5  3.5 - 5.1 mEq/L   Chloride 88 (*) 96 - 112 mEq/L   CO2 29  19 - 32 mEq/L   Glucose, Bld 81  70 - 99 mg/dL   BUN 12  6 - 23 mg/dL   Creatinine, Ser Amber.84  0.50 - 1.10 mg/dL   Calcium 9.2  8.4 - 69.6 mg/dL    Total Protein 6.8  6.0 - 8.3 g/dL   Albumin 3.2 (*) 3.5 - 5.2 g/dL   AST 25  0 - 37 U/L   ALT 12  0 - 35 U/L   Alkaline Phosphatase 58  39 - 117 U/L   Total Bilirubin 0.5  0.3 - 1.2 mg/dL   GFR calc non Af Amer 61 (*) >90 mL/min   GFR calc Af Amer 71 (*) >90 mL/min   Comment:            The eGFR has been calculated     using the CKD EPI equation.     This calculation has not been     validated in all clinical     situations.     eGFR's persistently     <90 mL/min signify     possible Chronic Kidney Disease.  VALPROIC ACID LEVEL     Status: Abnormal   Collection Time    03/16/12  2:27 PM      Result Value Range   Valproic Acid Lvl 124.6 (*) 50.0 - 100.0 ug/mL  POCT I-STAT TROPONIN I     Status: None   Collection Time    03/16/12  2:38 PM      Result Value Range   Troponin i, poc 0.01  0.00 - 0.08 ng/mL   Comment 3            Comment: Due to the release kinetics of cTnI,     a negative result within the first hours     of the onset of symptoms does not rule out     myocardial infarction with certainty.     If myocardial infarction is still suspected,     repeat the test at appropriate intervals.  POCT I-STAT, CHEM 8     Status: Abnormal   Collection Time    03/16/12  2:41 PM      Result Value Range   Sodium 128 (*) 135 - 145 mEq/L   Potassium 3.5  3.5 - 5.1 mEq/L   Chloride 91 (*) 96 - 112 mEq/L   BUN 12  6 - 23 mg/dL   Creatinine, Ser 2.95 (*) 0.50 - 1.10 mg/dL   Glucose, Bld 82  70 - 99 mg/dL   Calcium, Ion 2.84  1.32 - 1.30 mmol/L   TCO2 29  0 - 100 mmol/L   Hemoglobin 11.9 (*) 12.0 - 15.0 g/dL   HCT 44.0 (*) 10.2 - 72.5 %  AMMONIA     Status: None   Collection Time    03/16/12  3:45 PM      Result Value Range   Ammonia 20  11 - 60 umol/L  URINE RAPID DRUG SCREEN (HOSP PERFORMED)     Status: Abnormal  Collection Time    03/16/12  5:49 PM      Result Value Range   Opiates NONE DETECTED  NONE DETECTED   Cocaine NONE DETECTED  NONE DETECTED    Benzodiazepines POSITIVE (*) NONE DETECTED   Amphetamines NONE DETECTED  NONE DETECTED   Tetrahydrocannabinol NONE DETECTED  NONE DETECTED   Barbiturates NONE DETECTED  NONE DETECTED   Comment:            DRUG SCREEN FOR MEDICAL PURPOSES     ONLY.  IF CONFIRMATION IS NEEDED     FOR ANY PURPOSE, NOTIFY LAB     WITHIN 5 DAYS.                LOWEST DETECTABLE LIMITS     FOR URINE DRUG SCREEN     Drug Class       Cutoff (ng/mL)     Amphetamine      1000     Barbiturate      200     Benzodiazepine   200     Tricyclics       300     Opiates          300     Cocaine          300     THC              50  URINALYSIS, ROUTINE W REFLEX MICROSCOPIC     Status: Abnormal   Collection Time    03/16/12  5:49 PM      Result Value Range   Color, Urine YELLOW  YELLOW   APPearance CLEAR  CLEAR   Specific Gravity, Urine 1.010  1.005 - 1.030   pH 6.5  5.0 - 8.0   Glucose, UA NEGATIVE  NEGATIVE mg/dL   Hgb urine dipstick SMALL (*) NEGATIVE   Bilirubin Urine NEGATIVE  NEGATIVE   Ketones, ur NEGATIVE  NEGATIVE mg/dL   Protein, ur NEGATIVE  NEGATIVE mg/dL   Urobilinogen, UA 0.2  0.0 - 1.0 mg/dL   Nitrite NEGATIVE  NEGATIVE   Leukocytes, UA TRACE (*) NEGATIVE  URINE MICROSCOPIC-ADD ON     Status: Abnormal   Collection Time    03/16/12  5:49 PM      Result Value Range   WBC, UA Amber-10  <3 WBC/hpf   RBC / HPF 3-6  <3 RBC/hpf   Casts HYALINE CASTS (*) NEGATIVE  CBC     Status: Abnormal   Collection Time    03/16/12  6:05 PM      Result Value Range   WBC 8.3  4.0 - 10.5 K/uL   RBC 3.46 (*) 3.87 - 5.11 MIL/uL   Hemoglobin 11.3 (*) 12.0 - 15.0 g/dL   HCT 16.1 (*) 09.6 - 04.5 %   MCV 90.8  78.0 - 100.0 fL   MCH 32.Amber  26.0 - 34.0 pg   MCHC 36.0  30.0 - 36.0 g/dL   RDW 40.9  81.1 - 91.4 %   Platelets 199  150 - 400 K/uL  CREATININE, SERUM     Status: Abnormal   Collection Time    03/16/12  6:05 PM      Result Value Range   Creatinine, Ser 0.72  0.50 - 1.10 mg/dL   Comment: DELTA CHECK NOTED    GFR calc non Af Amer 85 (*) >90 mL/min   GFR calc Af Amer >90  >90 mL/min   Comment:  The eGFR has been calculated     using the CKD EPI equation.     This calculation has not been     validated in all clinical     situations.     eGFR's persistently     <90 mL/min signify     possible Chronic Kidney Disease.  TSH     Status: None   Collection Time    03/16/12  6:05 PM      Result Value Range   TSH 1.456  0.350 - 4.500 uIU/mL  AMMONIA     Status: None   Collection Time    03/16/12  6:05 PM      Result Value Range   Ammonia 25  11 - 60 umol/L  MRSA PCR SCREENING     Status: Abnormal   Collection Time    03/16/12  6:37 PM      Result Value Range   MRSA by PCR POSITIVE (*) NEGATIVE   Comment:            The GeneXpert MRSA Assay (FDA     approved for NASAL specimens     only), is one component of a     comprehensive MRSA colonization     surveillance program. It is not     intended to diagnose MRSA     infection nor to guide or     monitor treatment for     MRSA infections.     RESULT CALLED TO, READ BACK BY AND VERIFIED WITH:     OBASOGIE,P RN 2032 03/16/12 MITCHELL,L  URINALYSIS, ROUTINE W REFLEX MICROSCOPIC     Status: Abnormal   Collection Time    03/16/12  8:15 PM      Result Value Range   Color, Urine YELLOW  YELLOW   APPearance CLEAR  CLEAR   Specific Gravity, Urine 1.010  1.005 - 1.030   pH 6.5  5.0 - 8.0   Glucose, UA NEGATIVE  NEGATIVE mg/dL   Hgb urine dipstick TRACE (*) NEGATIVE   Bilirubin Urine NEGATIVE  NEGATIVE   Ketones, ur NEGATIVE  NEGATIVE mg/dL   Protein, ur NEGATIVE  NEGATIVE mg/dL   Urobilinogen, UA 0.2  0.0 - 1.0 mg/dL   Nitrite NEGATIVE  NEGATIVE   Leukocytes, UA NEGATIVE  NEGATIVE  URINE MICROSCOPIC-ADD ON     Status: None   Collection Time    03/16/12  8:15 PM      Result Value Range   WBC, UA 0-2  <3 WBC/hpf   RBC / HPF 0-2  <3 RBC/hpf  GLUCOSE, CAPILLARY     Status: None   Collection Time    03/16/12 10:40 PM      Result  Value Range   Glucose-Capillary 92  70 - 99 mg/dL   Comment 1 Documented in Chart     Comment 2 Notify RN    GLUCOSE, CAPILLARY     Status: None   Collection Time    03/17/12  Amber:16 AM      Result Value Range   Glucose-Capillary 83  70 - 99 mg/dL   Comment 1 Documented in Chart     Comment 2 Notify RN    COMPREHENSIVE METABOLIC PANEL     Status: Abnormal   Collection Time    03/17/12  Amber:30 AM      Result Value Range   Sodium 129 (*) 135 - 145 mEq/L   Potassium 3.0 (*) 3.5 - 5.1 mEq/L   Chloride 93 (*)  96 - 112 mEq/L   CO2 25  19 - 32 mEq/L   Glucose, Bld 80  70 - 99 mg/dL   BUN 9  6 - 23 mg/dL   Creatinine, Ser 9.60  0.50 - 1.10 mg/dL   Calcium 8.8  8.4 - 45.4 mg/dL   Total Protein 5.8 (*) 6.0 - 8.3 g/dL   Albumin 2.Amber (*) 3.5 - 5.2 g/dL   AST 19  0 - 37 U/L   ALT 9  0 - 35 U/L   Alkaline Phosphatase 59  39 - 117 U/L   Total Bilirubin 0.3  0.3 - 1.2 mg/dL   GFR calc non Af Amer >90  >90 mL/min   GFR calc Af Amer >90  >90 mL/min   Comment:            The eGFR has been calculated     using the CKD EPI equation.     This calculation has not been     validated in all clinical     situations.     eGFR's persistently     <90 mL/min signify     possible Chronic Kidney Disease.  CBC     Status: Abnormal   Collection Time    03/17/12  Amber:30 AM      Result Value Range   WBC 6.8  4.0 - 10.5 K/uL   RBC 3.33 (*) 3.87 - 5.11 MIL/uL   Hemoglobin 10.9 (*) 12.0 - 15.0 g/dL   HCT 09.8 (*) 11.9 - 14.Amber %   MCV 91.0  78.0 - 100.0 fL   MCH 32.Amber  26.0 - 34.0 pg   MCHC 36.0  30.0 - 36.0 g/dL   RDW 82.9  56.2 - 13.0 %   Platelets 217  150 - 400 K/uL  TROPONIN I     Status: None   Collection Time    03/17/12  Amber:30 AM      Result Value Range   Troponin I <0.30  <0.30 ng/mL   Comment:            Due to the release kinetics of cTnI,     a negative result within the first hours     of the onset of symptoms does not rule out     myocardial infarction with certainty.     If myocardial  infarction is still suspected,     repeat the test at appropriate intervals.  BASIC METABOLIC PANEL     Status: Abnormal   Collection Time    03/17/12 10:00 AM      Result Value Range   Sodium 129 (*) 135 - 145 mEq/L   Potassium 3.0 (*) 3.5 - 5.1 mEq/L   Chloride 91 (*) 96 - 112 mEq/L   CO2 28  19 - 32 mEq/L   Glucose, Bld 162 (*) 70 - 99 mg/dL   BUN 9  6 - 23 mg/dL   Creatinine, Ser 8.65  0.50 - 1.10 mg/dL   Calcium 8.8  8.4 - 78.4 mg/dL   GFR calc non Af Amer 90 (*) >90 mL/min   GFR calc Af Amer >90  >90 mL/min   Comment:            The eGFR has been calculated     using the CKD EPI equation.     This calculation has not been     validated in all clinical     situations.     eGFR's persistently     <  90 mL/min signify     possible Chronic Kidney Disease.  CORTISOL     Status: None   Collection Time    03/17/12 10:00 AM      Result Value Range   Cortisol, Plasma 22.4     Comment: (NOTE)     AM:  4.3 - 22.4 ug/dL     PM:  3.1 - 16.1 ug/dL  GLUCOSE, CAPILLARY     Status: Abnormal   Collection Time    03/17/12 11:13 AM      Result Value Range   Glucose-Capillary 123 (*) 70 - 99 mg/dL  GLUCOSE, CAPILLARY     Status: Abnormal   Collection Time    03/17/12  4:51 PM      Result Value Range   Glucose-Capillary 171 (*) 70 - 99 mg/dL  GLUCOSE, CAPILLARY     Status: None   Collection Time    03/17/12  9:31 PM      Result Value Range   Glucose-Capillary 93  70 - 99 mg/dL   Comment 1 Documented in Chart     Comment 2 Notify RN    BASIC METABOLIC PANEL     Status: None   Collection Time    03/18/12  6:30 AM      Result Value Range   Sodium 137  135 - 145 mEq/L   Potassium 4.0  3.5 - 5.1 mEq/L   Chloride 103  96 - 112 mEq/L   CO2 27  19 - 32 mEq/L   Glucose, Bld 92  70 - 99 mg/dL   BUN 6  6 - 23 mg/dL   Creatinine, Ser 0.96  0.50 - 1.10 mg/dL   Calcium 8.8  8.4 - 04.5 mg/dL   GFR calc non Af Amer >90  >90 mL/min   GFR calc Af Amer >90  >90 mL/min   Comment:             The eGFR has been calculated     using the CKD EPI equation.     This calculation has not been     validated in all clinical     situations.     eGFR's persistently     <90 mL/min signify     possible Chronic Kidney Disease.  VALPROIC ACID LEVEL     Status: Abnormal   Collection Time    03/18/12  6:30 AM      Result Value Range   Valproic Acid Lvl 42.9 (*) 50.0 - 100.0 ug/mL  GLUCOSE, CAPILLARY     Status: None   Collection Time    03/18/12  6:37 AM      Result Value Range   Glucose-Capillary 88  70 - 99 mg/dL   Comment 1 Documented in Chart     Comment 2 Notify RN       HPI : 72 y.o. [place]  Past medical history of partial seizures, hypothyroidism depression anxiety disorder that comes in for slurred speech as per patient's family. She apparently was okay and 9 AM and a sister saw her again about 11 AM she had slurred speech was walking more slowly and seemed more confused. She had no focal weakness. A CT scan of the head which done in the emergency room that showed no acute intracranial abnormalities. A code stroke was called which was later canceled by the neurologist that she had no clear objective deficits. Further evaluation the patient has also been complaining that her urine sitting smelling bad, she  relates no incontinent. So we were asked to admit and further evaluate for metabolic encephalopathy. Her Depakote level was drawn which was borderline high. Denies any fever chills cough shortness of breath nausea vomiting or diarrhea.  A basic metabolic panel show an increase in her creatinine and with some hyponatremia and hypochloremia    HOSPITAL COURSE:  #1Metabolic encephalopathy-multifactorial secondary to/ AKI/Hyponatremia/hypokalemia /UTI/elevated valproic acid level  Depakote was held, repeat level is 42.9, this will be resumed today Has a history of seizure disorder but has remained seizure-free, no indication for EEG at this time Symptoms have resolved TSH is  1.46 She has B12 deficiency we'll check a vitamin B 12 level and continue oral B12 replacement   #2 hyponatremia thought to be secondary to dehydration With good resolution with IV fluids Sodium upon presentation was 128 now 137 Cortisol level was found to be 22.4  #3 hypokalemia Repleted Home oral potassium supplementation has been increased to 40 inches by mouth daily   #4Hypertension continue metoprolol , Norvasc   #5UTI on Rocephin , switched to ciprofloxacin for another 5 days  #6 Bradycardia:  - She is on a beta blocker at home. She has remained asymptomatic. We'll go ahead and continue her beta blocker.       Discharge Exam:   Blood pressure 138/59, pulse 61, temperature 97.2 F (36.2 C), temperature source Oral, resp. rate 18, height 5\' 1"  (1.549 m), weight 47.401 kg (104 lb 8 oz), SpO2 97.00%.      General: Awake alert and oriented x3  Eyes: Anicteric no pallor  ENT: Dry mucous membranes  Neck: No JVD  Cardiovascular: Regular rate and rhythm bradycardic with positive S1 and S2  Respiratory: Good air movement clear to auscultation  Abdomen: Positive bowel sounds suprapubic tenderness in the suprapubic area non distended soft  Skin: No rashes or ulcerations  Musculoskeletal: Intact  Psychiatric: Appropriate  Neurologic: Awake alert and oriented x3 coherent for language , she is slightly slow to respond to questions. 3-12 are grossly intact sensation is intact throughout muscle strength is 5 over 5 in all 4 extremities deep tendon reflexes 2+ bilaterally finger-to-nose is normal the     Signed: Jolean Madariaga 03/18/2012, 10:12 AM

## 2012-03-19 NOTE — Care Management Note (Signed)
    Page 1 of 1   03/19/2012     7:55:41 AM   CARE MANAGEMENT NOTE 03/19/2012  Patient:  [name],Everli   Account Number:  [id number]  Date Initiated:  03/18/2012  Documentation initiated by:  [facility]  Subjective/Objective Assessment:   admitted with acute encephalopathy     Action/Plan:   PT/OT evals- no follow up recommended   Anticipated DC Date:  03/18/2012   Anticipated DC Plan:  HOME/SELF CARE      DC Planning Services  CM consult      Choice offered to / List presented to:             Status of service:  Completed, signed off Medicare Important Message given?   (If response is "NO", the following Medicare IM given date fields will be blank) Date Medicare IM given:   Date Additional Medicare IM given:    Discharge Disposition:  HOME/SELF CARE  Per UR Regulation:  Reviewed for med. necessity/level of care/duration of stay  If discussed at Long Length of Stay Meetings, dates discussed:    Comments:

## 2013-12-04 ENCOUNTER — Emergency Department (HOSPITAL_COMMUNITY): Payer: Medicare Other

## 2013-12-04 ENCOUNTER — Inpatient Hospital Stay (HOSPITAL_COMMUNITY)
Admission: EM | Admit: 2013-12-04 | Discharge: 2013-12-10 | DRG: 640 | Disposition: A | Discharge status: Skilled Nursing Facility | Payer: Medicare Other | Attending: Internal Medicine | Admitting: Internal Medicine

## 2013-12-04 ENCOUNTER — Encounter (HOSPITAL_COMMUNITY): Payer: Self-pay | Admitting: Emergency Medicine

## 2013-12-04 DIAGNOSIS — F17210 Nicotine dependence, cigarettes, uncomplicated: Secondary | ICD-10-CM | POA: Diagnosis present

## 2013-12-04 DIAGNOSIS — F419 Anxiety disorder, unspecified: Secondary | ICD-10-CM | POA: Diagnosis present

## 2013-12-04 DIAGNOSIS — E872 Acidosis: Secondary | ICD-10-CM | POA: Diagnosis present

## 2013-12-04 DIAGNOSIS — G40909 Epilepsy, unspecified, not intractable, without status epilepticus: Secondary | ICD-10-CM | POA: Diagnosis present

## 2013-12-04 DIAGNOSIS — R05 Cough: Secondary | ICD-10-CM

## 2013-12-04 DIAGNOSIS — I10 Essential (primary) hypertension: Secondary | ICD-10-CM | POA: Diagnosis present

## 2013-12-04 DIAGNOSIS — F0390 Unspecified dementia without behavioral disturbance: Secondary | ICD-10-CM | POA: Diagnosis present

## 2013-12-04 DIAGNOSIS — F42 Obsessive-compulsive disorder: Secondary | ICD-10-CM | POA: Diagnosis present

## 2013-12-04 DIAGNOSIS — R4182 Altered mental status, unspecified: Secondary | ICD-10-CM

## 2013-12-04 DIAGNOSIS — Z8249 Family history of ischemic heart disease and other diseases of the circulatory system: Secondary | ICD-10-CM | POA: Diagnosis not present

## 2013-12-04 DIAGNOSIS — E039 Hypothyroidism, unspecified: Secondary | ICD-10-CM | POA: Diagnosis present

## 2013-12-04 DIAGNOSIS — R059 Cough, unspecified: Secondary | ICD-10-CM

## 2013-12-04 DIAGNOSIS — G9349 Other encephalopathy: Secondary | ICD-10-CM | POA: Diagnosis present

## 2013-12-04 DIAGNOSIS — K219 Gastro-esophageal reflux disease without esophagitis: Secondary | ICD-10-CM | POA: Diagnosis present

## 2013-12-04 DIAGNOSIS — J189 Pneumonia, unspecified organism: Secondary | ICD-10-CM | POA: Diagnosis present

## 2013-12-04 DIAGNOSIS — E871 Hypo-osmolality and hyponatremia: Principal | ICD-10-CM | POA: Diagnosis present

## 2013-12-04 DIAGNOSIS — R319 Hematuria, unspecified: Secondary | ICD-10-CM | POA: Diagnosis present

## 2013-12-04 DIAGNOSIS — G9340 Encephalopathy, unspecified: Secondary | ICD-10-CM | POA: Diagnosis present

## 2013-12-04 LAB — CBC WITH DIFFERENTIAL/PLATELET
BASOS ABS: 0 10*3/uL (ref 0.0–0.1)
BASOS PCT: 0 % (ref 0–1)
EOS ABS: 0 10*3/uL (ref 0.0–0.7)
Eosinophils Relative: 0 % (ref 0–5)
HCT: 39.7 % (ref 36.0–46.0)
HEMOGLOBIN: 14 g/dL (ref 12.0–15.0)
Lymphocytes Relative: 10 % — ABNORMAL LOW (ref 12–46)
Lymphs Abs: 1.1 10*3/uL (ref 0.7–4.0)
MCH: 32 pg (ref 26.0–34.0)
MCHC: 35.3 g/dL (ref 30.0–36.0)
MCV: 90.8 fL (ref 78.0–100.0)
Monocytes Absolute: 1 10*3/uL (ref 0.1–1.0)
Monocytes Relative: 9 % (ref 3–12)
NEUTROS ABS: 9 10*3/uL — AB (ref 1.7–7.7)
NEUTROS PCT: 81 % — AB (ref 43–77)
PLATELETS: 204 10*3/uL (ref 150–400)
RBC: 4.37 MIL/uL (ref 3.87–5.11)
RDW: 13.4 % (ref 11.5–15.5)
WBC: 11.1 10*3/uL — ABNORMAL HIGH (ref 4.0–10.5)

## 2013-12-04 LAB — URINALYSIS, ROUTINE W REFLEX MICROSCOPIC
BILIRUBIN URINE: NEGATIVE
GLUCOSE, UA: NEGATIVE mg/dL
KETONES UR: 15 mg/dL — AB
LEUKOCYTES UA: NEGATIVE
NITRITE: NEGATIVE
PH: 6.5 (ref 5.0–8.0)
Protein, ur: NEGATIVE mg/dL
SPECIFIC GRAVITY, URINE: 1.018 (ref 1.005–1.030)
Urobilinogen, UA: 1 mg/dL (ref 0.0–1.0)

## 2013-12-04 LAB — COMPREHENSIVE METABOLIC PANEL
ALBUMIN: 4.4 g/dL (ref 3.5–5.2)
ALK PHOS: 80 U/L (ref 39–117)
ALT: 29 U/L (ref 0–35)
ANION GAP: 16 — AB (ref 5–15)
AST: 45 U/L — AB (ref 0–37)
BILIRUBIN TOTAL: 0.5 mg/dL (ref 0.3–1.2)
BUN: 16 mg/dL (ref 6–23)
CHLORIDE: 87 meq/L — AB (ref 96–112)
CO2: 25 mEq/L (ref 19–32)
Calcium: 10.4 mg/dL (ref 8.4–10.5)
Creatinine, Ser: 0.59 mg/dL (ref 0.50–1.10)
GFR calc Af Amer: >90 mL/min (ref >90)
GFR calc non Af Amer: 89 mL/min — ABNORMAL LOW (ref >90)
Glucose, Bld: 99 mg/dL (ref 70–99)
POTASSIUM: 3.7 meq/L (ref 3.7–5.3)
SODIUM: 128 meq/L — AB (ref 137–147)
TOTAL PROTEIN: 8.4 g/dL — AB (ref 6.0–8.3)

## 2013-12-04 LAB — URINE MICROSCOPIC-ADD ON

## 2013-12-04 LAB — CBG MONITORING, ED: GLUCOSE-CAPILLARY: 119 mg/dL — AB (ref 70–99)

## 2013-12-04 LAB — CK: CK TOTAL: 142 U/L (ref 7–177)

## 2013-12-04 LAB — TSH: TSH: 2.25 u[IU]/mL (ref 0.350–4.500)

## 2013-12-04 LAB — VALPROIC ACID LEVEL: VALPROIC ACID LVL: 87.4 ug/mL (ref 50.0–100.0)

## 2013-12-04 LAB — TROPONIN I

## 2013-12-04 LAB — AMMONIA: Ammonia: 20 umol/L (ref 11–60)

## 2013-12-04 MED ORDER — HYDROXYCHLOROQUINE SULFATE 200 MG PO TABS
400.0000 mg | ORAL_TABLET | Freq: Every day | ORAL | Status: DC
  Administered 2013-12-04 – 2013-12-10 (×7): 400 mg via ORAL
  Filled 2013-12-04 (×6): qty 2

## 2013-12-04 MED ORDER — DIVALPROEX SODIUM 500 MG PO DR TAB
500.0000 mg | DELAYED_RELEASE_TABLET | Freq: Every day | ORAL | Status: DC
  Administered 2013-12-05 – 2013-12-10 (×6): 500 mg via ORAL
  Filled 2013-12-04 (×6): qty 1

## 2013-12-04 MED ORDER — MIRTAZAPINE 15 MG PO TABS
15.0000 mg | ORAL_TABLET | Freq: Every day | ORAL | Status: DC
  Administered 2013-12-04 – 2013-12-09 (×5): 15 mg via ORAL
  Filled 2013-12-04 (×7): qty 1

## 2013-12-04 MED ORDER — METOPROLOL TARTRATE 50 MG PO TABS
50.0000 mg | ORAL_TABLET | Freq: Every day | ORAL | Status: DC
  Administered 2013-12-04 – 2013-12-10 (×7): 50 mg via ORAL
  Filled 2013-12-04 (×5): qty 1
  Filled 2013-12-04: qty 2
  Filled 2013-12-04: qty 1

## 2013-12-04 MED ORDER — ALPRAZOLAM 0.5 MG PO TABS: 1.0000 mg | ORAL_TABLET | Freq: Three times a day (TID) | ORAL | Status: DC | PRN

## 2013-12-04 MED ORDER — DIPHENOXYLATE-ATROPINE 2.5-0.025 MG PO TABS: 1.0000 | ORAL_TABLET | Freq: Four times a day (QID) | ORAL | Status: DC | PRN

## 2013-12-04 MED ORDER — LEVOTHYROXINE SODIUM 25 MCG PO TABS
25.0000 ug | ORAL_TABLET | Freq: Every day | ORAL | Status: DC
  Administered 2013-12-05 – 2013-12-10 (×6): 25 ug via ORAL
  Filled 2013-12-04 (×6): qty 1

## 2013-12-04 MED ORDER — SERTRALINE HCL 50 MG PO TABS
50.0000 mg | ORAL_TABLET | Freq: Every day | ORAL | Status: DC
  Administered 2013-12-04 – 2013-12-10 (×7): 50 mg via ORAL
  Filled 2013-12-04 (×7): qty 1

## 2013-12-04 MED ORDER — SODIUM CHLORIDE 0.9 % IV SOLN
INTRAVENOUS | Status: AC
  Administered 2013-12-04: 20:00:00 via INTRAVENOUS

## 2013-12-04 MED ORDER — LORAZEPAM 2 MG/ML IJ SOLN: 1.0000 mg | INTRAMUSCULAR | Status: DC | PRN

## 2013-12-04 MED ORDER — ENOXAPARIN SODIUM 40 MG/0.4ML ~~LOC~~ SOLN
40.0000 mg | SUBCUTANEOUS | Status: DC
  Administered 2013-12-04 – 2013-12-06 (×3): 40 mg via SUBCUTANEOUS
  Filled 2013-12-04 (×3): qty 0.4

## 2013-12-04 MED ORDER — PANTOPRAZOLE SODIUM 40 MG PO TBEC
40.0000 mg | DELAYED_RELEASE_TABLET | Freq: Every day | ORAL | Status: DC
  Administered 2013-12-04 – 2013-12-10 (×7): 40 mg via ORAL
  Filled 2013-12-04 (×7): qty 1

## 2013-12-04 MED ORDER — ACETAMINOPHEN 325 MG PO TABS: 650.0000 mg | ORAL_TABLET | Freq: Four times a day (QID) | ORAL | Status: DC | PRN

## 2013-12-04 MED ORDER — ACETAMINOPHEN 650 MG RE SUPP: 650.0000 mg | Freq: Four times a day (QID) | RECTAL | Status: DC | PRN

## 2013-12-04 MED ORDER — OXYCODONE HCL 5 MG PO TABS
5.0000 mg | ORAL_TABLET | Freq: Three times a day (TID) | ORAL | Status: DC | PRN
  Administered 2013-12-05 – 2013-12-08 (×6): 5 mg via ORAL
  Filled 2013-12-04 (×6): qty 1

## 2013-12-04 NOTE — ED Notes (Signed)
Secretary noted pt family brought [employer] for pt to eat.  Notified Dr Preston FleetingGlick.

## 2013-12-04 NOTE — H&P (Signed)
Date: 12/04/2013               Patient Name:  Amber Pierce MRN: [id number]  DOB: Sep 14, 1940 Age / Sex: 73 y.o., female   PCP: Aggie CosierBalaji Desai, MD         Medical Service: Internal Medicine Teaching Service         Attending Physician: Dr. Earl LagosNischal Narendra, MD    First Contact: Dr. Heywood Ilesushil Mehtaab Mayeda Pager: [phone]  Second Contact: Dr. Carlynn PurlErik Hoffman Pager: [phone]       After Hours (After 5p/  First Contact Pager: [phone]  weekends / holidays): Second Contact Pager: [phone]   Chief Complaint: altered mental status  History of Present Illness: Amber Pierce is a 73 year old female with h/o partial seizure, OCD, hypothyroidism who presents with altered mental status. Sister was present and contributed to the majority of the HPI.  Per sister, she was found this morning leaning to one side and was incoherent for 10 minutes, consistent with prior post-ictal states She has several similar episodes over the past few days and [facility] though this one was more severe. She has a history of seizures, and she thinks this recent episode is related to her pain medication. She normally takes 3 tablets of oxycodone for back pain, but she was running out and thus dropped to 2 tablets. To compensate, she took more Xanax (regular dose of 1mg  TID), but when she started to run out of her Xanax, she then took 0.5mg  TID. She had no Xanax today on the day of admission.   She was hospitalized back in February 2014 and April 2013 for altered mental status, each time with concomitant electrolyte abnormalities and seizures.   At bedside, she is minimally responsive to questions. She denies chest pain, shortness of breath, difficulty urinating. On exam, she coughed up green sputum.   Meds: No current facility-administered medications for this encounter.   Current Outpatient Prescriptions  Medication Sig Dispense Refill  . ALPRAZolam (XANAX) 0.5 MG tablet Take 2 tablets (1 mg total) by mouth at  bedtime as needed for sleep or anxiety. For anxiety  30 tablet  0  . B Complex-C-Min-Fe-FA (HEMATINIC PLUS VIT/MINERALS PO) Take 1 tablet by mouth daily.      . diphenoxylate-atropine (LOMOTIL) 2.5-0.025 MG per tablet Take 1 tablet by mouth 4 (four) times daily as needed for diarrhea or loose stools. For diarrhea      . divalproex (DEPAKOTE) 500 MG DR tablet Take 500 mg by mouth daily.      . hydroxychloroquine (PLAQUENIL) 200 MG tablet Take 400 mg by mouth daily.      Amber Pierce. levothyroxine (SYNTHROID, LEVOTHROID) 25 MCG tablet Take 25 mcg by mouth daily.      . metoprolol (LOPRESSOR) 50 MG tablet Take 50 mg by mouth daily.      . mirtazapine (REMERON) 15 MG tablet Take 15 mg by mouth at bedtime.      Amber Pierce. omeprazole (PRILOSEC) 20 MG capsule Take 20 mg by mouth daily.      Amber Pierce. oxyCODONE (OXY IR/ROXICODONE) 5 MG immediate release tablet Take 5 mg by mouth every 8 (eight) hours as needed for pain. For pain      . potassium chloride SA (K-DUR,KLOR-CON) 20 MEQ tablet Take 40 mEq by mouth daily.      . promethazine (PHENERGAN) 12.5 MG tablet Take 12.5 mg by mouth every 6 (six) hours as needed for nausea or vomiting.      .Amber Pierce  sertraline (ZOLOFT) 50 MG tablet Take 50 mg by mouth daily.      . vitamin B-12 (CYANOCOBALAMIN) 500 MCG tablet Take 500 mcg by mouth daily.      Amber Pierce. amLODipine (NORVASC) 10 MG tablet Take 1 tablet (10 mg total) by mouth daily.  30 tablet  0  . fluvoxaMINE (LUVOX) 100 MG tablet Take 1 tablet (100 mg total) by mouth 2 (two) times daily.  60 tablet  0    Allergies: Allergies as of 12/04/2013 - Review Complete 12/04/2013  Allergen Reaction Noted  . Iron cr [iron]  12/04/2013   Past Medical History  Diagnosis Date  . Hypertension   . Thyroid disease   . Seizures   . Arthritis   . GERD (gastroesophageal reflux disease)   . Anxiety    History reviewed. No pertinent past surgical history. Family History  Problem Relation Age of Onset  . Heart attack Father    History   Social History   . Marital Status: Divorced    Spouse Name: N/A    Number of Children: N/A  . Years of Education: N/A   Occupational History  . Not on file.   Social History Main Topics  . Smoking status: Current Every Day Smoker -- 0.50 packs/day    Types: Cigarettes  . Smokeless tobacco: Not on file  . Alcohol Use: No  . Drug Use: Not on file  . Sexual Activity: No   Other Topics Concern  . Not on file   Social History Narrative  . No narrative on file    Review of Systems: Review of Systems  Respiratory: Positive for sputum production. Negative for shortness of breath.   Cardiovascular: Negative for chest pain.  Neurological: Positive for speech change and seizures.     Physical Exam: Blood pressure 166/79, pulse 71, temperature 98.4 F (36.9 C), temperature source Oral, resp. rate 16, SpO2 100.00%.  General: resting in bed, NAD HEENT: PERRL, EOMI, no scleral icterus Cardiac: RRR, no rubs, murmurs or gallops Pulm: clear to auscultation bilaterally, no wheezes, rales, or rhonchi Abd: soft, nontender, nondistended, BS present Ext: warm and well perfused, 2+ pitting edema BLE Neuro: oriented to name and place but not date (April 18, 2013)   Lab results: Basic Metabolic Panel:  Recent Labs  95/62/1310/30/15 1512  NA 128*  K 3.7  CL 87*  CO2 25  GLUCOSE 99  BUN 16  CREATININE 0.59  CALCIUM 10.4   Liver Function Tests:  Recent Labs  12/04/13 1512  AST 45*  ALT 29  ALKPHOS 80  BILITOT 0.5  PROT 8.4*  ALBUMIN 4.4   CBC:  Recent Labs  12/04/13 1512  WBC 11.1*  NEUTROABS 9.0*  HGB 14.0  HCT 39.7  MCV 90.8  PLT 204   CBG:  Recent Labs  12/04/13 1420  GLUCAP 119*   Urinalysis:  Recent Labs  12/04/13 1525  COLORURINE YELLOW  LABSPEC 1.018  PHURINE 6.5  GLUCOSEU NEGATIVE  HGBUR MODERATE*  BILIRUBINUR NEGATIVE  KETONESUR 15*  PROTEINUR NEGATIVE  UROBILINOGEN 1.0  NITRITE NEGATIVE  LEUKOCYTESUR NEGATIVE   Imaging results:  Ct Head Wo  Contrast  12/04/2013   CLINICAL DATA:  73 year old female, found down, unresponsive. Possible seizure this morning. Initial encounter.  EXAM: CT HEAD WITHOUT CONTRAST  TECHNIQUE: Contiguous axial images were obtained from the base of the skull through the vertex without intravenous contrast.  COMPARISON:  Head CT without contrast 03/16/2012.  FINDINGS: Sequelae of upper cervical posterior fusion hardware  placement re- identified. Stable visualized osseous structures. No acute orbit or scalp soft tissue findings. Visualized paranasal sinuses and mastoids are clear.  Calcified atherosclerosis at the skull base. Stable cerebral volume. Mild ventricular prominence unchanged and felt to be physiologic for this patient. Patchy and confluent chronic cerebral white matter hypodensity is stable. No midline shift, mass effect, or evidence of intracranial mass lesion. No acute intracranial hemorrhage identified. No evidence of cortically based acute infarction identified. No suspicious intracranial vascular hyperdensity.  IMPRESSION: No acute intracranial abnormality. Chronic nonspecific white matter changes.   Electronically Signed   By: Augusto Gamble M.D.   On: 12/04/2013 16:05   Dg Chest Port 1 View  12/04/2013   CLINICAL DATA:  Subsequent encounter for seizure activity this morning.  EXAM: PORTABLE CHEST - 1 VIEW  COMPARISON:  05/25/2011.  FINDINGS: 1709 hrs. Lungs are clear without focal airspace consolidation, edema, or pleural effusion. The cardiopericardial silhouette is within normal limits for size. Imaged bony structures of the thorax are intact. Telemetry leads overlie the chest.  IMPRESSION: Stable.  No acute findings   Electronically Signed   By: Kennith Center M.D.   On: 12/04/2013 17:19    Other results: EKG: Reviewed and compared with 03/16/12 Normal rate and rhythm T-wave inversions in V2, V3   Assessment & Plan by Problem: Active Problems:   Altered mental status   Hypothyroidism   Hyponatremia    Acute encephalopathy   Amber Pierce is a 73 year old female with seizure disorder, OCD, hypothyroidism who presents with altered mental status found to have hyponatremia.  Altered mental status: Possible causes include post-ictal state, infection, benzo withdrawal, hyponatremia. Leukocytosis is suspicious for infection, especially given productive cough, though CXR reassuring against pneumonia. Post-ictal state possible given seizure-like activity. Benzo withdrawal possible given missing Xanax doses today thought less likely since it has only been one day. Hyponatremia possible as the cause since it was noted as contributory in prior hospitalizations. EKG changes concerning for cardiac etiology as well -Consult Neurology -Give IV NS @ 75cc/hr -Recheck BMET to avoid rapid overcorrection -Continue Xanax 1mg  TID -Continue seizure precautions & neuro checks q4h -Check troponins x 3  Hypothyroidism: TSH pending.   OCD: Continue home psych meds.  Seizure disorder: As noted above.  #FEN:  -Diet: NPO -NS @ 75cc/hr  #DVT prophylaxis: Lovenox  #CODE STATUS: FULL CODE  Dispo: Disposition is deferred at this time, awaiting improvement of current medical problems. Anticipated discharge in approximately 1-2 day(s).   The patient does have a current PCP Aggie Cosier, MD) and does not need an [facility] follow-up appointment after discharge.  The patient does have transportation limitations that hinder transportation to clinic appointments.  Signed: Heywood Iles, MD 12/04/2013, 5:27 PM

## 2013-12-04 NOTE — Progress Notes (Signed)
Pt arrived to 4N07. Alert and oriented x3; disoriented to time. Pt oriented to unit. Call bell and phone within reach. Bed alarm on. Will continue to monitor.

## 2013-12-04 NOTE — ED Notes (Signed)
Per family pt has history of seizures and takes depakote. Pt had possible seizure activity this am. Pt was found leaning to one side and appeared postictal. Pt having similar episodes over the past few days, went to [facility] but reports episode this am was more severe. Pt alert and answering questions at triage, reports taking meds as directed.

## 2013-12-04 NOTE — Consult Note (Signed)
Consult Reason for Consult:AMS, seizures [facility]  CC: AMS  HPI: Amber Pierce is an 73 y.o. female female presenting with seizures. The history is provided by the patient and a relative. The history is limited by the condition of the patient (Altered mental status).She was found by her sister with altered mentation and this morning. She was in a chair with snoring respirations and not responding. This lasted about 10 minutes. There was fecal incontinence but no bit lip or tongue. His sister is concerned that she may have had a seizure. After 10 minutes, she did become responsive but since then she has had fluctuating levels of mental status.  She is slow to answer questions and does not follow commands consistently. She is also demonstrated perseveration of speech she apparently has had several similar episodes the last several days and was seen [facility] yesterday at which time she was reported to have normal labs and normal CT scan. She does have history of seizure disorder for which she takes divalproex. There's been no known fever or chills. Family notes a productive cough with green sputum over the past few days.  Upon arrival, patient upright in bed eating a sandwich.    Past Medical History  Diagnosis Date  . Hypertension   . Thyroid disease   . Seizures   . Arthritis   . GERD (gastroesophageal reflux disease)   . Anxiety     History reviewed. No pertinent past surgical history.  Family History  Problem Relation Age of Onset  . Heart attack Father     Social History:  reports that she has been smoking Cigarettes.  She has been smoking about 0.50 packs per day. She does not have any smokeless tobacco history on file. She reports that she does not drink alcohol. Her drug history is not on file.  Allergies  Allergen Reactions  . Iron Cr [Iron]     Use the bathroom a lot     Medications:  Scheduled: . ALPRAZolam  1 mg Oral TID  .  amLODipine  10 mg Oral Daily  . divalproex  500 mg Oral Daily  . enoxaparin (LOVENOX) injection  40 mg Subcutaneous Q24H  . hydroxychloroquine  400 mg Oral Daily  . levothyroxine  25 mcg Oral QAC breakfast  . metoprolol  50 mg Oral Daily  . mirtazapine  15 mg Oral QHS  . pantoprazole  40 mg Oral Daily  . sertraline  50 mg Oral Daily    ROS: Out of a complete 14 system review, the patient complains of only the following symptoms, and all other reviewed systems are negative.  Physical Examination: Blood pressure 166/79, pulse 72, temperature 98.4 F (36.9 C), temperature source Oral, resp. rate 19, SpO2 99.00%.  Neurologic Examination Mental Status: Alert, oriented to name and location, states date is "March 2005". Follows simple one and 2 step commands with repeated requests. Poor recent and remote memory. Speech limited but no notable aphasia. No dysarthria Cranial Nerves: II: funduscopic exam wnl bilaterally, visual fields grossly normal, pupils equal, round, reactive to light and accommodation III,IV, VI: ptosis not present, extra-ocular motions intact bilaterally V,VII: smile symmetric, facial light touch sensation normal bilaterally VIII: hearing normal bilaterally IX,X: gag reflex present XI: trapezius strength/neck flexion strength normal bilaterally XII: tongue strength normal  Motor: Unable to do formal motor exam but appears to move all extremities symmetrically and against light resistance Tone and bulk:normal tone throughout; no atrophy noted Sensory:  Pinprick and light touch intact throughout, bilaterally Deep Tendon Reflexes: 2+ and symmetric throughout Plantars: Right: downgoing   Left: downgoing Cerebellar: normal finger-to-nose, normal rapid alternating movements and normal heel-to-shin test Gait: deferred due to mental status  Laboratory Studies:   Basic Metabolic Panel:  Recent Labs Lab 12/04/13 1512  NA 128*  K 3.7  CL 87*  CO2 25  GLUCOSE 99  BUN  16  CREATININE 0.59  CALCIUM 10.4    Liver Function Tests:  Recent Labs Lab 12/04/13 1512  AST 45*  ALT 29  ALKPHOS 80  BILITOT 0.5  PROT 8.4*  ALBUMIN 4.4   No results found for this basename: LIPASE, AMYLASE,  in the last 168 hours No results found for this basename: AMMONIA,  in the last 168 hours  CBC:  Recent Labs Lab 12/04/13 1512  WBC 11.1*  NEUTROABS 9.0*  HGB 14.0  HCT 39.7  MCV 90.8  PLT 204    Cardiac Enzymes: No results found for this basename: CKTOTAL, CKMB, CKMBINDEX, TROPONINI,  in the last 168 hours  BNP: No components found with this basename: POCBNP,   CBG:  Recent Labs Lab 12/04/13 1420  GLUCAP 119*    Microbiology: Results for orders placed during the hospital encounter of 03/16/12  MRSA PCR SCREENING     Status: Abnormal   Collection Time    03/16/12  6:37 PM      Result Value Ref Range Status   MRSA by PCR POSITIVE (*) NEGATIVE Final   Comment:            The GeneXpert MRSA Assay (FDA     approved for NASAL specimens     only), is one component of a     comprehensive MRSA colonization     surveillance program. It is not     intended to diagnose MRSA     infection nor to guide or     monitor treatment for     MRSA infections.     RESULT CALLED TO, READ BACK BY AND VERIFIED WITH:     OBASOGIE,P RN 2032 03/16/12 MITCHELL,L    Coagulation Studies: No results found for this basename: LABPROT, INR,  in the last 72 hours  Urinalysis:  Recent Labs Lab 12/04/13 1525  COLORURINE YELLOW  LABSPEC 1.018  PHURINE 6.5  GLUCOSEU NEGATIVE  HGBUR MODERATE*  BILIRUBINUR NEGATIVE  KETONESUR 15*  PROTEINUR NEGATIVE  UROBILINOGEN 1.0  NITRITE NEGATIVE  LEUKOCYTESUR NEGATIVE    Lipid Panel:   HgbA1C:  No results found for this basename: HGBA1C    Urine Drug Screen:     Component Value Date/Time   LABOPIA NONE DETECTED 03/16/2012 1749   COCAINSCRNUR NONE DETECTED 03/16/2012 1749   LABBENZ POSITIVE* 03/16/2012 1749   AMPHETMU  NONE DETECTED 03/16/2012 1749   THCU NONE DETECTED 03/16/2012 1749   LABBARB NONE DETECTED 03/16/2012 1749    Alcohol Level: No results found for this basename: ETH,  in the last 168 hours   Imaging: Ct Head Wo Contrast  12/04/2013   CLINICAL DATA:  73 year old female, found down, unresponsive. Possible seizure this morning. Initial encounter.  EXAM: CT HEAD WITHOUT CONTRAST  TECHNIQUE: Contiguous axial images were obtained from the base of the skull through the vertex without intravenous contrast.  COMPARISON:  Head CT without contrast 03/16/2012.  FINDINGS: Sequelae of upper cervical posterior fusion hardware placement re- identified. Stable visualized osseous structures. No acute orbit or scalp soft tissue findings. Visualized paranasal sinuses and mastoids are  clear.  Calcified atherosclerosis at the skull base. Stable cerebral volume. Mild ventricular prominence unchanged and felt to be physiologic for this patient. Patchy and confluent chronic cerebral white matter hypodensity is stable. No midline shift, mass effect, or evidence of intracranial mass lesion. No acute intracranial hemorrhage identified. No evidence of cortically based acute infarction identified. No suspicious intracranial vascular hyperdensity.  IMPRESSION: No acute intracranial abnormality. Chronic nonspecific white matter changes.   Electronically Signed   By: Augusto Gamble M.D.   On: 12/04/2013 16:05   Dg Chest Port 1 View  12/04/2013   CLINICAL DATA:  Subsequent encounter for seizure activity this morning.  EXAM: PORTABLE CHEST - 1 VIEW  COMPARISON:  05/25/2011.  FINDINGS: 1709 hrs. Lungs are clear without focal airspace consolidation, edema, or pleural effusion. The cardiopericardial silhouette is within normal limits for size. Imaged bony structures of the thorax are intact. Telemetry leads overlie the chest.  IMPRESSION: Stable.  No acute findings   Electronically Signed   By: Kennith Center M.D.   On: 12/04/2013 17:19      Assessment/Plan:  73y/o woman with known seizure disorder presenting for breakthrough seizure with continued AMS. Continues to have AMS but based on exam she does not appear to be in subclinical status at this time. Mild leukocytosis with unclear source. Family notes productive cough but CXR unremarkable. VPA level therapeutic at 87.4. Other labs pertinent for sodium 128 which may be contributing to her AMS. Patient noted to have frontal release signs on exam indicating underlying dementia.   -check EEG -check ammonia, TSH, B12 -continue VPA 500mg  daily. Ativan prn for breakthrough seizures. No indication for loading dose or additional AED at this time -correction of sodium per primary team -if mental status does not improve with improvement in sodium would check MRI brain.   Elspeth Cho, DO [facility] [phone]  If 7pm- 7am, please page neurology on call as listed in AMION. 12/04/2013, 5:30 PM

## 2013-12-04 NOTE — ED Provider Notes (Signed)
CSN: [id number]     Arrival date & time 12/04/13  1354 History   First MD Initiated Contact with Patient 12/04/13 1453     Chief Complaint  Patient presents with  . Seizures     (Consider location/radiation/quality/duration/timing/severity/associated sxs/prior Treatment) Patient is a 73 y.o. female presenting with seizures. The history is provided by the patient and a relative. The history is limited by the condition of the patient (Altered mental status).  Seizures She was found by her sister with altered mentation and this morning. She was in a chair with snoring respirations and not responding. This lasted about 10 minutes. There was fecal incontinence but no bit lip or tongue. His sister is concerned that she may have had a seizure. After 10 minutes, she did become responsive but she has not read gained her normal mentation. She is slow to answer questions and does not follow commands consistently. She is also demonstrated perseveration of speech she apparently has had several similar episodes the last several days and was seen at [facility] yesterday at which time she was reported to have normal labs and normal CT scan. She does have history of seizure disorder for which she takes divalproex. There's been no known fever or chills. There's been no known cough. She has had several falls and does have an abrasion over her left knee. Sisters also concerned about possibility of Parkinson's disease. Of note, she history for chronic back pain and recently had her dose of oxycodone reduced from 3 tablets a day to 2 tablets a day. Her dose is a 5 mg tablet.  Past Medical History  Diagnosis Date  . Hypertension   . Thyroid disease   . Seizures   . Arthritis   . GERD (gastroesophageal reflux disease)   . Anxiety    History reviewed. No pertinent past surgical history. Family History  Problem Relation Age of Onset  . Heart attack Father    History  Substance Use Topics  . Smoking  status: Current Every Day Smoker -- 0.50 packs/day    Types: Cigarettes  . Smokeless tobacco: Not on file  . Alcohol Use: No   OB History   Grav Para Term Preterm Abortions TAB SAB Ect Mult Living                 Review of Systems  Unable to perform ROS: Mental status change  Neurological: Positive for seizures.      Allergies  Review of patient's allergies indicates no known allergies.  Home Medications   Prior to Admission medications   Medication Sig Start Date End Date Taking? Authorizing Provider  ALPRAZolam Prudy Feeler) 0.5 MG tablet Take 2 tablets (1 mg total) by mouth at bedtime as needed for sleep or anxiety. For anxiety 03/18/12   Richarda Overlie, MD  amLODipine (NORVASC) 10 MG tablet Take 1 tablet (10 mg total) by mouth daily. 05/28/11 05/27/12  Rhetta Mura, MD  B Complex-C-Min-Fe-FA (HEMATINIC PLUS VIT/MINERALS PO) Take 1 tablet by mouth daily.    Historical Provider, MD  diphenoxylate-atropine (LOMOTIL) 2.5-0.025 MG per tablet Take 1 tablet by mouth 4 (four) times daily as needed for diarrhea or loose stools. For diarrhea    Historical Provider, MD  divalproex (DEPAKOTE) 500 MG DR tablet Take 500 mg by mouth daily.    Historical Provider, MD  fluvoxaMINE (LUVOX) 100 MG tablet Take 1 tablet (100 mg total) by mouth 2 (two) times daily. 05/28/11 06/27/11  Rhetta Mura, MD  hydroxychloroquine (PLAQUENIL) 200 MG  tablet Take 400 mg by mouth daily.    Historical Provider, MD  levothyroxine (SYNTHROID, LEVOTHROID) 25 MCG tablet Take 25 mcg by mouth daily.    Historical Provider, MD  metoprolol (LOPRESSOR) 50 MG tablet Take 50 mg by mouth daily.    Historical Provider, MD  mirtazapine (REMERON) 15 MG tablet Take 15 mg by mouth at bedtime.    Historical Provider, MD  oxyCODONE (OXY IR/ROXICODONE) 5 MG immediate release tablet Take 5 mg by mouth every 8 (eight) hours as needed for pain. For pain    Historical Provider, MD  potassium chloride SA (K-DUR,KLOR-CON) 20 MEQ tablet  Take 2 tablets (40 mEq total) by mouth daily. 03/18/12   Richarda OverlieNayana Abrol, MD  sertraline (ZOLOFT) 50 MG tablet Take 50 mg by mouth daily.    Historical Provider, MD  vitamin B-12 (CYANOCOBALAMIN) 500 MCG tablet Take 500 mcg by mouth daily.    Historical Provider, MD   BP 189/73  Pulse 73  Temp(Src) 97.8 F (36.6 C) (Oral)  Resp 18  SpO2 100% Physical Exam  Nursing note and vitals reviewed.  73 year old female, resting comfortably and in no acute distress. Vital signs are significant for hypertension. Oxygen saturation is 100%, which is normal. Head is normocephalic and atraumatic. Right cornea is opacified and people cannot be evaluated, left pupil responds to light, EOMI. Oropharynx is clear. Neck is nontender and supple without adenopathy or JVD. There are no carotid bruits. Back is nontender and there is no CVA tenderness. Lungs are clear without rales, wheezes, or rhonchi. Chest is nontender. Heart has regular rate and rhythm without murmur. Abdomen is soft, flat, nontender without masses or hepatosplenomegaly and peristalsis is normoactive. Extremities have no cyanosis or edema, full range of motion is present. Minor abrasion is present over the anterior aspect of the left knee but full range of motion is present and there is no swelling or deformity. Heberden's and Bouchard's nodes are present on the hands. Skin is warm and dry without rash. Neurologic: She is awake and oriented to person and place but poorly oriented to time. She is slow to answer questions and slow to follow commands. Cranial nerves are intact, there are no motor or sensory deficits. Moderate cogwheel rigidity is present.  ED Course  Procedures (including critical care time) Labs Review Results for orders placed during the hospital encounter of 12/04/13  VALPROIC ACID LEVEL      Result Value Ref Range   Valproic Acid Lvl 87.4  50.0 - 100.0 ug/mL  CBC WITH DIFFERENTIAL      Result Value Ref Range   WBC 11.1 (*)  4.0 - 10.5 K/uL   RBC 4.37  3.87 - 5.11 MIL/uL   Hemoglobin 14.0  12.0 - 15.0 g/dL   HCT 69.639.7  29.536.0 - 28.446.0 %   MCV 90.8  78.0 - 100.0 fL   MCH 32.0  26.0 - 34.0 pg   MCHC 35.3  30.0 - 36.0 g/dL   RDW 13.213.4  44.011.5 - 10.215.5 %   Platelets 204  150 - 400 K/uL   Neutrophils Relative % 81 (*) 43 - 77 %   Neutro Abs 9.0 (*) 1.7 - 7.7 K/uL   Lymphocytes Relative 10 (*) 12 - 46 %   Lymphs Abs 1.1  0.7 - 4.0 K/uL   Monocytes Relative 9  3 - 12 %   Monocytes Absolute 1.0  0.1 - 1.0 K/uL   Eosinophils Relative 0  0 - 5 %  Eosinophils Absolute 0.0  0.0 - 0.7 K/uL   Basophils Relative 0  0 - 1 %   Basophils Absolute 0.0  0.0 - 0.1 K/uL  COMPREHENSIVE METABOLIC PANEL      Result Value Ref Range   Sodium 128 (*) 137 - 147 mEq/L   Potassium 3.7  3.7 - 5.3 mEq/L   Chloride 87 (*) 96 - 112 mEq/L   CO2 25  19 - 32 mEq/L   Glucose, Bld 99  70 - 99 mg/dL   BUN 16  6 - 23 mg/dL   Creatinine, Ser 4.090.59  0.50 - 1.10 mg/dL   Calcium 81.110.4  8.4 - 91.410.5 mg/dL   Total Protein 8.4 (*) 6.0 - 8.3 g/dL   Albumin 4.4  3.5 - 5.2 g/dL   AST 45 (*) 0 - 37 U/L   ALT 29  0 - 35 U/L   Alkaline Phosphatase 80  39 - 117 U/L   Total Bilirubin 0.5  0.3 - 1.2 mg/dL   GFR calc non Af Amer 89 (*) >90 mL/min   GFR calc Af Amer >90  >90 mL/min   Anion gap 16 (*) 5 - 15  URINALYSIS, ROUTINE W REFLEX MICROSCOPIC      Result Value Ref Range   Color, Urine YELLOW  YELLOW   APPearance CLEAR  CLEAR   Specific Gravity, Urine 1.018  1.005 - 1.030   pH 6.5  5.0 - 8.0   Glucose, UA NEGATIVE  NEGATIVE mg/dL   Hgb urine dipstick MODERATE (*) NEGATIVE   Bilirubin Urine NEGATIVE  NEGATIVE   Ketones, ur 15 (*) NEGATIVE mg/dL   Protein, ur NEGATIVE  NEGATIVE mg/dL   Urobilinogen, UA 1.0  0.0 - 1.0 mg/dL   Nitrite NEGATIVE  NEGATIVE   Leukocytes, UA NEGATIVE  NEGATIVE  URINE MICROSCOPIC-ADD ON      Result Value Ref Range   Squamous Epithelial / LPF RARE  RARE   WBC, UA 0-2  <3 WBC/hpf   RBC / HPF 11-20  <3 RBC/hpf   Bacteria, UA  RARE  RARE   Casts HYALINE CASTS (*) NEGATIVE  AMMONIA      Result Value Ref Range   Ammonia 20  11 - 60 umol/L  TSH      Result Value Ref Range   TSH 2.250  0.350 - 4.500 uIU/mL  CK      Result Value Ref Range   Total CK 142  7 - 177 U/L  TROPONIN I      Result Value Ref Range   Troponin I <0.30  <0.30 ng/mL  CBG MONITORING, ED      Result Value Ref Range   Glucose-Capillary 119 (*) 70 - 99 mg/dL   Imaging Review Ct Head Wo Contrast  12/04/2013   CLINICAL DATA:  73 year old female, found down, unresponsive. Possible seizure this morning. Initial encounter.  EXAM: CT HEAD WITHOUT CONTRAST  TECHNIQUE: Contiguous axial images were obtained from the base of the skull through the vertex without intravenous contrast.  COMPARISON:  Head CT without contrast 03/16/2012.  FINDINGS: Sequelae of upper cervical posterior fusion hardware placement re- identified. Stable visualized osseous structures. No acute orbit or scalp soft tissue findings. Visualized paranasal sinuses and mastoids are clear.  Calcified atherosclerosis at the skull base. Stable cerebral volume. Mild ventricular prominence unchanged and felt to be physiologic for this patient. Patchy and confluent chronic cerebral white matter hypodensity is stable. No midline shift, mass effect, or evidence of intracranial mass lesion.  No acute intracranial hemorrhage identified. No evidence of cortically based acute infarction identified. No suspicious intracranial vascular hyperdensity.  IMPRESSION: No acute intracranial abnormality. Chronic nonspecific white matter changes.   Electronically Signed   By: Augusto Gamble M.D.   On: 12/04/2013 16:05   Dg Chest Port 1 View  12/04/2013   CLINICAL DATA:  Subsequent encounter for seizure activity this morning.  EXAM: PORTABLE CHEST - 1 VIEW  COMPARISON:  05/25/2011.  FINDINGS: 1709 hrs. Lungs are clear without focal airspace consolidation, edema, or pleural effusion. The cardiopericardial silhouette is within  normal limits for size. Imaged bony structures of the thorax are intact. Telemetry leads overlie the chest.  IMPRESSION: Stable.  No acute findings   Electronically Signed   By: Kennith Center M.D.   On: 12/04/2013 17:19     EKG Interpretation   Date/Time:  Friday December 04 2013 15:29:11 EDT Ventricular Rate:  72 PR Interval:  154 QRS Duration: 85 QT Interval:  408 QTC Calculation: 446 R Axis:   79 Text Interpretation:  Sinus rhythm Anterior infarct, age indeterminate  When compared with ECG of 03/16/2012, No significant change was found  Confirmed by [facility]  MD, Vania Rosero ([id number]) on 12/04/2013 3:36:02 PM      MDM   Final diagnoses:  Altered mental status, unspecified altered mental status type  Hyponatremia  Seizure disorder    Probable seizure with prolonged postictal state. With recent falls, CT of head needs to be obtained to rule out subdural hematoma. Old records are reviewed and she has wire admissions for altered mentation which were related to metabolic derangements and nontoxic blood levels divalproex. Metabolic panel will be checked as well as valproic acid level.  Laboratory workup and the CT scans are unremarkable. At this point, I'm concerned that altered mentation may be related to ongoing seizure activity. Neurology consultation was obtained and arrangements are made to admit the patient.  Dione Booze, MD 12/05/13 [phone]

## 2013-12-05 ENCOUNTER — Inpatient Hospital Stay (HOSPITAL_COMMUNITY): Payer: Medicare Other

## 2013-12-05 DIAGNOSIS — M7989 Other specified soft tissue disorders: Secondary | ICD-10-CM

## 2013-12-05 DIAGNOSIS — E872 Acidosis: Secondary | ICD-10-CM

## 2013-12-05 DIAGNOSIS — E039 Hypothyroidism, unspecified: Secondary | ICD-10-CM

## 2013-12-05 DIAGNOSIS — D72829 Elevated white blood cell count, unspecified: Secondary | ICD-10-CM

## 2013-12-05 DIAGNOSIS — R319 Hematuria, unspecified: Secondary | ICD-10-CM

## 2013-12-05 DIAGNOSIS — R609 Edema, unspecified: Secondary | ICD-10-CM

## 2013-12-05 DIAGNOSIS — G9340 Encephalopathy, unspecified: Secondary | ICD-10-CM

## 2013-12-05 DIAGNOSIS — F42 Obsessive-compulsive disorder: Secondary | ICD-10-CM

## 2013-12-05 DIAGNOSIS — E871 Hypo-osmolality and hyponatremia: Principal | ICD-10-CM

## 2013-12-05 DIAGNOSIS — G40909 Epilepsy, unspecified, not intractable, without status epilepticus: Secondary | ICD-10-CM

## 2013-12-05 LAB — BLOOD GAS, ARTERIAL
Acid-Base Excess: 0.7 mmol/L (ref 0.0–2.0)
BICARBONATE: 24.4 meq/L — AB (ref 20.0–24.0)
Drawn by: 24486
FIO2: 0.21 %
O2 Saturation: 94.7 %
Patient temperature: 98.6
TCO2: 25.4 mmol/L (ref 0–100)
pCO2 arterial: 35.8 mmHg (ref 35.0–45.0)
pH, Arterial: 7.448 (ref 7.350–7.450)
pO2, Arterial: 71.8 mmHg — ABNORMAL LOW (ref 80.0–100.0)

## 2013-12-05 LAB — CBC
HCT: 36.4 % (ref 36.0–46.0)
Hemoglobin: 12.6 g/dL (ref 12.0–15.0)
MCH: 32.1 pg (ref 26.0–34.0)
MCHC: 34.6 g/dL (ref 30.0–36.0)
MCV: 92.6 fL (ref 78.0–100.0)
PLATELETS: 198 10*3/uL (ref 150–400)
RBC: 3.93 MIL/uL (ref 3.87–5.11)
RDW: 13.7 % (ref 11.5–15.5)
WBC: 12 10*3/uL — ABNORMAL HIGH (ref 4.0–10.5)

## 2013-12-05 LAB — BASIC METABOLIC PANEL
ANION GAP: 18 — AB (ref 5–15)
BUN: 11 mg/dL (ref 6–23)
CALCIUM: 9.6 mg/dL (ref 8.4–10.5)
CO2: 23 meq/L (ref 19–32)
CREATININE: 0.64 mg/dL (ref 0.50–1.10)
Chloride: 93 mEq/L — ABNORMAL LOW (ref 96–112)
GFR calc Af Amer: >90 mL/min (ref >90)
GFR calc non Af Amer: 87 mL/min — ABNORMAL LOW (ref >90)
Glucose, Bld: 104 mg/dL — ABNORMAL HIGH (ref 70–99)
Potassium: 3.9 mEq/L (ref 3.7–5.3)
SODIUM: 134 meq/L — AB (ref 137–147)

## 2013-12-05 LAB — VITAMIN B12: Vitamin B-12: 736 pg/mL (ref 211–911)

## 2013-12-05 LAB — TROPONIN I
Troponin I: <0.3 ng/mL (ref <0.30)
Troponin I: <0.3 ng/mL (ref <0.30)

## 2013-12-05 LAB — HIV ANTIBODY (ROUTINE TESTING W REFLEX): HIV 1&2 Ab, 4th Generation: NONREACTIVE

## 2013-12-05 LAB — APTT: aPTT: 31 seconds (ref 24–37)

## 2013-12-05 LAB — LACTIC ACID, PLASMA: Lactic Acid, Venous: 1.3 mmol/L (ref 0.5–2.2)

## 2013-12-05 LAB — SALICYLATE LEVEL: Salicylate Lvl: <2 mg/dL — ABNORMAL LOW (ref 2.8–20.0)

## 2013-12-05 MED ORDER — DEXTROSE-NACL 5-0.9 % IV SOLN
INTRAVENOUS | Status: AC
  Administered 2013-12-05: 18:00:00 via INTRAVENOUS

## 2013-12-05 MED ORDER — HYDRALAZINE HCL 20 MG/ML IJ SOLN
5.0000 mg | Freq: Four times a day (QID) | INTRAMUSCULAR | Status: DC | PRN
  Administered 2013-12-05: 5 mg via INTRAVENOUS
  Filled 2013-12-05: qty 1

## 2013-12-05 MED ORDER — AMLODIPINE BESYLATE 10 MG PO TABS
10.0000 mg | ORAL_TABLET | Freq: Every day | ORAL | Status: DC
  Administered 2013-12-05 – 2013-12-10 (×6): 10 mg via ORAL
  Filled 2013-12-05 (×7): qty 1

## 2013-12-05 MED ORDER — ALPRAZOLAM 0.5 MG PO TABS
1.0000 mg | ORAL_TABLET | Freq: Three times a day (TID) | ORAL | Status: DC
  Administered 2013-12-05 (×2): 1 mg via ORAL
  Filled 2013-12-05 (×2): qty 2

## 2013-12-05 MED ORDER — DEXTROSE 5 % IV SOLN
1.0000 g | INTRAVENOUS | Status: DC
  Administered 2013-12-05 – 2013-12-08 (×3): 1 g via INTRAVENOUS
  Filled 2013-12-05 (×4): qty 10

## 2013-12-05 MED ORDER — SODIUM CHLORIDE 0.9 % IV SOLN
INTRAVENOUS | Status: DC
  Administered 2013-12-05: 15:00:00 via INTRAVENOUS

## 2013-12-05 MED ORDER — AMLODIPINE BESYLATE 5 MG PO TABS: 5.0000 mg | ORAL_TABLET | Freq: Every day | ORAL | Status: DC

## 2013-12-05 MED ORDER — AZITHROMYCIN 500 MG IV SOLR
500.0000 mg | INTRAVENOUS | Status: DC
  Administered 2013-12-05 – 2013-12-08 (×3): 500 mg via INTRAVENOUS
  Filled 2013-12-05 (×4): qty 500

## 2013-12-05 NOTE — Progress Notes (Signed)
Pt seen and examined with Dr. Allena KatzPatel. Please refer to resident note for details  In brief, 73 y/o female with PMH of partial seizures, OCD, hypothyroidism p/w AMS * 1 day. History obtained from chart as patient is not able to answer questions. Pt was found yesterday incoherent and leaning to one side. [facility] but this was more severe.  Pt has not been taking her meds as prescribed- she had been taking increased xanax but was running out so had to cut back and had not had any on the day of admission.  Pt is minimally responsive - opens eye but does not follow commands or answer questions  Exam: Gen: minimally responsive CVS: RRR, normal heart sounds Pulm: CTA b/l Abd: soft,non tender, BS + Ext: 2 + edema R>L  Assessment and Plan: 73 y/o female with AMS of uncertain etiology and noted to have AG metabolic acidosis  AMS: - Uncertain etiology - I discussed case with neuro - Will get stat MRI  - f/u EEG - If above w/u wnl neuro to get LP done today - c/w depakote. 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  - Repeat BMP in AM. Pt with normal LA and salicylate levels  Hyponatremia: - resolved now. Will monitor off IVF  LE edema: - LE dopplers negative for DVT. Will monitor

## 2013-12-05 NOTE — Progress Notes (Signed)
VASCULAR LAB PRELIMINARY  PRELIMINARY  PRELIMINARY  PRELIMINARY  Bilateral lower extremity venous Dopplers completed.    Preliminary report:  There is no obvious evidence of DVT or SVT noted in the bilateral lower extremities.   Amber Pierce, RVT 12/05/2013, 3:46 PM

## 2013-12-05 NOTE — Procedures (Signed)
ELECTROENCEPHALOGRAM REPORT   Patient: Amber Pierce       Room #: [id number] EEG No. ID: [id number] Age: 73 y.o.        Sex: female Referring Physician: Earlie RavelingNarenda Report Date:  12/05/2013        Interpreting Physician: Thana FarrEYNOLDS,Makinzee Durley D  History: Amber Pierce is an 73 y.o. female with a history of seizures presenting after an episode of unresponsiveness  Medications:  Scheduled: . ALPRAZolam  1 mg Oral TID  . amLODipine  10 mg Oral Daily  . azithromycin  500 mg Intravenous Q24H  . cefTRIAXone (ROCEPHIN)  IV  1 g Intravenous Q24H  . divalproex  500 mg Oral Daily  . enoxaparin (LOVENOX) injection  40 mg Subcutaneous Q24H  . hydroxychloroquine  400 mg Oral Daily  . levothyroxine  25 mcg Oral QAC breakfast  . metoprolol  50 mg Oral Daily  . mirtazapine  15 mg Oral QHS  . pantoprazole  40 mg Oral Daily  . sertraline  50 mg Oral Daily    Conditions of Recording:  This is a 16 channel EEG carried out with the patient in the awake state.  Patient tense throughout the recording.    Description:  The waking background activity consists of a low voltage mixture of theta and alpha activity that is diffusely distributed and poorly organized for the most part .  A distinct posterior background rhythm can be identified at times and does achieve 8 Hz.  Intermittently underlying the background activity are bursts of moderate to high voltage polymorphic delta activity.  At times there is intermixed discharges of triphasic morphology noted embedded in these bursts of polymorphic delta activity.  Hyperventilation and intermittent photic stimulation were not performed.  IMPRESSION: This is an abnormal electroencephalogram secondary to intermittent slowing and discharges of triphasic morphology.  These findings are consistent with an encephalopathy, etiology nonspecific.     Thana FarrLeslie Serah Nicoletti, MD [facility] [phone] [id number] 12/05/2013, 11:12 PM

## 2013-12-05 NOTE — Progress Notes (Signed)
12/05/13 0606  Vitals  Temp 98.1 F (36.7 C)  Temp Source Oral  BP ! 214/99 mmHg (Notified RN)  BP Location Right Arm  BP Method Automatic  Patient Position (if appropriate) Lying  Pulse Rate 74  Pulse Rate Source Monitor  Resp 16    12/05/13 0606  Vitals  Temp 98.1 F (36.7 C)  Temp Source Oral  BP ! 214/99 mmHg (Notified RN)  BP Location Right Arm  BP Method Automatic  Patient Position (if appropriate) Lying  Pulse Rate 74  Pulse Rate Source Monitor  Resp 16  BP 214/99. Medicated for pain. Physician notified. Nurse will follow up in 15 min with a BP recheck.

## 2013-12-05 NOTE — Progress Notes (Signed)
BP remains elevated at 212/91. MD paged.

## 2013-12-05 NOTE — Progress Notes (Signed)
EEG completed, results pending. 

## 2013-12-05 NOTE — H&P (Signed)
Pt seen and examined with Dr. Allena KatzPatel. Please refer to my progres note from today for further details

## 2013-12-05 NOTE — Progress Notes (Signed)
Subjective: Marked decline in mental status from initial assessment yesterday. No verbal output. No spontaneous movement  History: Amber Pierce is an 73 y.o. female female presenting with seizures. The history is provided by the patient and a relative. The history is limited by the condition of the patient (Altered mental status).She was found by her sister with altered mentation and this morning. She was in a chair with snoring respirations and not responding. This lasted about 10 minutes. There was fecal incontinence but no bit lip or tongue. His sister is concerned that she may have had a seizure. After 10 minutes, she did become responsive but since then she has had fluctuating levels of mental status. She is slow to answer questions and does not follow commands consistently. She is also demonstrated perseveration of speech she apparently has had several similar episodes the last several days and was [facility] yesterday at which time she was reported to have normal labs and normal CT scan. She does have history of seizure disorder for which she takes divalproex. There's been no known fever or chills. Family notes   Objective: Current vital signs: BP 212/91  Pulse 91  Temp(Src) 98.3 F (36.8 C) (Oral)  Resp 20  Wt 49.487 kg (109 lb 1.6 oz)  SpO2 100% Vital signs in last 24 hours: Temp:  [97.8 F (36.6 C)-99.5 F (37.5 C)] 98.3 F (36.8 C) (10/31 0810) Pulse Rate:  [61-91] 91 (10/31 0810) Resp:  [14-23] 20 (10/31 0810) BP: (147-214)/(52-99) 212/91 mmHg (10/31 0810) SpO2:  [95 %-100 %] 100 % (10/31 0216) Weight:  [48.898 kg (107 lb 12.8 oz)-49.487 kg (109 lb 1.6 oz)] 49.487 kg (109 lb 1.6 oz) (10/31 0500)  Intake/Output from previous day:   Intake/Output this shift:   Nutritional status: NPO  Neurologic Exam: Mental Status:  Non verbal, not following commands Cranial Nerves:  ZO:XWRUEAI:pupils equal, round, reactive to light and accommodation  III,IV, VI: ptosis not  present,eyes midline, no gaze preference, does not track examiner V,VII: smile symmetric, facial light touch sensation normal bilaterally  VIII: hearing normal bilaterally  IX,X: gag reflex present  XI: trapezius strength/neck flexion strength normal bilaterally  XII: tongue strength normal  Motor:  Unable to do formal motor exam but appears to move all extremities symmetrically and against light resistance . Minimal spontaneous movement noted Tone and bulk:normal tone throughout; no atrophy noted  Sensory: Pinprick and light touch intact throughout, bilaterally  Deep Tendon Reflexes: 2+ and symmetric throughout  Plantars:  Right: downgoing Left: downgoing  Cerebellar:  Unable to test Gait: deferred due to mental status   Lab Results: Basic Metabolic Panel:  Recent Labs Lab 12/04/13 1512 12/05/13 0508  NA 128* 134*  K 3.7 3.9  CL 87* 93*  CO2 25 23  GLUCOSE 99 104*  BUN 16 11  CREATININE 0.59 0.64  CALCIUM 10.4 9.6    Liver Function Tests:  Recent Labs Lab 12/04/13 1512  AST 45*  ALT 29  ALKPHOS 80  BILITOT 0.5  PROT 8.4*  ALBUMIN 4.4   No results found for this basename: LIPASE, AMYLASE,  in the last 168 hours  Recent Labs Lab 12/04/13 2010  AMMONIA 20    CBC:  Recent Labs Lab 12/04/13 1512 12/05/13 0508  WBC 11.1* 12.0*  NEUTROABS 9.0*  --   HGB 14.0 12.6  HCT 39.7 36.4  MCV 90.8 92.6  PLT 204 198    Cardiac Enzymes:  Recent Labs Lab 12/04/13 2010 12/05/13 0508  CKTOTAL 142  --   TROPONINI <0.30 <0.30    Lipid Panel: No results found for this basename: CHOL, TRIG, HDL, CHOLHDL, VLDL, LDLCALC,  in the last 168 hours  CBG:  Recent Labs Lab 12/04/13 1420  GLUCAP 119*    Microbiology: Results for orders placed during the hospital encounter of 03/16/12  MRSA PCR SCREENING     Status: Abnormal   Collection Time    03/16/12  6:37 PM      Result Value Ref Range Status   MRSA by PCR POSITIVE (*) NEGATIVE Final   Comment:             The GeneXpert MRSA Assay (FDA     approved for NASAL specimens     only), is one component of a     comprehensive MRSA colonization     surveillance program. It is not     intended to diagnose MRSA     infection nor to guide or     monitor treatment for     MRSA infections.     RESULT CALLED TO, READ BACK BY AND VERIFIED WITH:     OBASOGIE,P RN 2032 03/16/12 MITCHELL,L    Coagulation Studies: No results found for this basename: LABPROT, INR,  in the last 72 hours  Imaging: Ct Head Wo Contrast  12/04/2013   CLINICAL DATA:  73 year old female, found down, unresponsive. Possible seizure this morning. Initial encounter.  EXAM: CT HEAD WITHOUT CONTRAST  TECHNIQUE: Contiguous axial images were obtained from the base of the skull through the vertex without intravenous contrast.  COMPARISON:  Head CT without contrast 03/16/2012.  FINDINGS: Sequelae of upper cervical posterior fusion hardware placement re- identified. Stable visualized osseous structures. No acute orbit or scalp soft tissue findings. Visualized paranasal sinuses and mastoids are clear.  Calcified atherosclerosis at the skull base. Stable cerebral volume. Mild ventricular prominence unchanged and felt to be physiologic for this patient. Patchy and confluent chronic cerebral white matter hypodensity is stable. No midline shift, mass effect, or evidence of intracranial mass lesion. No acute intracranial hemorrhage identified. No evidence of cortically based acute infarction identified. No suspicious intracranial vascular hyperdensity.  IMPRESSION: No acute intracranial abnormality. Chronic nonspecific white matter changes.   Electronically Signed   By: Augusto GambleLee  Hall M.D.   On: 12/04/2013 16:05   Dg Chest Port 1 View  12/04/2013   CLINICAL DATA:  Subsequent encounter for seizure activity this morning.  EXAM: PORTABLE CHEST - 1 VIEW  COMPARISON:  05/25/2011.  FINDINGS: 1709 hrs. Lungs are clear without focal airspace consolidation, edema, or  pleural effusion. The cardiopericardial silhouette is within normal limits for size. Imaged bony structures of the thorax are intact. Telemetry leads overlie the chest.  IMPRESSION: Stable.  No acute findings   Electronically Signed   By: Kennith CenterEric  Mansell M.D.   On: 12/04/2013 17:19    Medications:  Scheduled: . ALPRAZolam  1 mg Oral TID  . amLODipine  10 mg Oral Daily  . divalproex  500 mg Oral Daily  . enoxaparin (LOVENOX) injection  40 mg Subcutaneous Q24H  . hydroxychloroquine  400 mg Oral Daily  . levothyroxine  25 mcg Oral QAC breakfast  . metoprolol  50 mg Oral Daily  . mirtazapine  15 mg Oral QHS  . pantoprazole  40 mg Oral Daily  . sertraline  50 mg Oral Daily    Assessment/Plan:  73y/o woman with known seizure disorder presenting for breakthrough seizure with continued AMS. Continues to have  AMS but based on exam she does not appear to be in subclinical status at this time. Mild leukocytosis with unclear source. Family notes productive cough but CXR unremarkable. VPA level therapeutic at 87.4. Other labs pertinent for sodium 128 which may be contributing to her AMS. Patient noted to have frontal release signs on exam indicating underlying dementia.   She appears to have a marked decline in mental status today compared to initial assessment on 10/30. With leukocytosis would have concern over infectious process.   -EEG pending -Na level improving. Avoid rapid correction -continue VPA 500mg  daily. Ativan prn for breakthrough seizures. No indication for loading dose or additional AED at this time  -MRI brain -if MRI and EEG unremarkable will plan for LP    LOS: 1 day   Elspeth Cho, DO [facility] [phone]  If 7pm- 7am, please page neurology on call as listed in AMION. 12/05/2013  10:13 AM

## 2013-12-05 NOTE — Progress Notes (Signed)
Subjective: This AM, she responded less than she was yesterday which is concerning.   Objective: Vital signs in last 24 hours: Filed Vitals:   12/05/13 0606 12/05/13 0810 12/05/13 1047 12/05/13 1225  BP: 214/99 212/91 196/98 209/75  Pulse: 74 91 66 65  Temp: 98.1 F (36.7 C) 98.3 F (36.8 C) 98.2 F (36.8 C) 98.5 F (36.9 C)  TempSrc: Oral Oral Axillary Oral  Resp: 16 20 18 18   Weight:      SpO2:   95% 97%   Weight change:  No intake or output data in the 24 hours ending 12/05/13 1440  General: resting in bed, more somnolent, NAD  HEENT: PERRL, EOMI, no scleral icterus  Cardiac: RRR, no rubs, murmurs or gallops  Pulm: clear to auscultation bilaterally, no wheezes, rales, or rhonchi  Abd: soft, nontender, nondistended, BS present  Ext: warm and well perfused, 2+ pitting edema BLE with swelling R > L Neuro: unable to assess given lack of responses though was moving both UE freely   Lab Results: Basic Metabolic Panel:  Recent Labs Lab 12/04/13 1512 12/05/13 0508  NA 128* 134*  K 3.7 3.9  CL 87* 93*  CO2 25 23  GLUCOSE 99 104*  BUN 16 11  CREATININE 0.59 0.64  CALCIUM 10.4 9.6   CBC:  Recent Labs Lab 12/04/13 1512 12/05/13 0508  WBC 11.1* 12.0*  NEUTROABS 9.0*  --   HGB 14.0 12.6  HCT 39.7 36.4  MCV 90.8 92.6  PLT 204 198   Cardiac Enzymes:  Recent Labs Lab 12/04/13 2010 12/05/13 0508 12/05/13 1153  CKTOTAL 142  --   --   TROPONINI <0.30 <0.30 <0.30   Urinalysis:  Recent Labs Lab 12/04/13 1525  COLORURINE YELLOW  LABSPEC 1.018  PHURINE 6.5  GLUCOSEU NEGATIVE  HGBUR MODERATE*  BILIRUBINUR NEGATIVE  KETONESUR 15*  PROTEINUR NEGATIVE  UROBILINOGEN 1.0  NITRITE NEGATIVE  LEUKOCYTESUR NEGATIVE   Studies/Results: Dg Chest 2 View  12/05/2013   CLINICAL DATA:  Cough  EXAM: CHEST  2 VIEW  COMPARISON:  Radiograph 12/04/2013  FINDINGS: Normal cardiac silhouette. Lungs are hyperinflated. Patient rotated slightly rightward which exaggerates  the right hilum. On the lateral projection there is opacity over the heart suggesting lingular pneumonia. No pneumothorax.  IMPRESSION: Concern for lingular pneumonia.   Electronically Signed   By: Genevive BiStewart  Edmunds M.D.   On: 12/05/2013 10:14   Ct Head Wo Contrast  12/04/2013   CLINICAL DATA:  73 year old female, found down, unresponsive. Possible seizure this morning. Initial encounter.  EXAM: CT HEAD WITHOUT CONTRAST  TECHNIQUE: Contiguous axial images were obtained from the base of the skull through the vertex without intravenous contrast.  COMPARISON:  Head CT without contrast 03/16/2012.  FINDINGS: Sequelae of upper cervical posterior fusion hardware placement re- identified. Stable visualized osseous structures. No acute orbit or scalp soft tissue findings. Visualized paranasal sinuses and mastoids are clear.  Calcified atherosclerosis at the skull base. Stable cerebral volume. Mild ventricular prominence unchanged and felt to be physiologic for this patient. Patchy and confluent chronic cerebral white matter hypodensity is stable. No midline shift, mass effect, or evidence of intracranial mass lesion. No acute intracranial hemorrhage identified. No evidence of cortically based acute infarction identified. No suspicious intracranial vascular hyperdensity.  IMPRESSION: No acute intracranial abnormality. Chronic nonspecific white matter changes.   Electronically Signed   By: Augusto GambleLee  Hall M.D.   On: 12/04/2013 16:05   Dg Chest Port 1 View  12/04/2013   CLINICAL DATA:  Subsequent encounter for seizure activity this morning.  EXAM: PORTABLE CHEST - 1 VIEW  COMPARISON:  05/25/2011.  FINDINGS: 1709 hrs. Lungs are clear without focal airspace consolidation, edema, or pleural effusion. The cardiopericardial silhouette is within normal limits for size. Imaged bony structures of the thorax are intact. Telemetry leads overlie the chest.  IMPRESSION: Stable.  No acute findings   Electronically Signed   By: Kennith CenterEric   Mansell M.D.   On: 12/04/2013 17:19   Medications: I have reviewed the patient's current medications. Scheduled Meds: . ALPRAZolam  1 mg Oral TID  . amLODipine  10 mg Oral Daily  . divalproex  500 mg Oral Daily  . enoxaparin (LOVENOX) injection  40 mg Subcutaneous Q24H  . hydroxychloroquine  400 mg Oral Daily  . levothyroxine  25 mcg Oral QAC breakfast  . metoprolol  50 mg Oral Daily  . mirtazapine  15 mg Oral QHS  . pantoprazole  40 mg Oral Daily  . sertraline  50 mg Oral Daily   Continuous Infusions: . sodium chloride     PRN Meds:.acetaminophen, acetaminophen, diphenoxylate-atropine, hydrALAZINE, LORazepam, oxyCODONE Assessment/Plan: Active Problems:   Altered mental status   Hypothyroidism   Hyponatremia   Acute encephalopathy  [name] is a 73 year old female with seizure disorder, OCD, hypothyroidism hospitalized for acute encephalopathy now with improved hyponatremia but anion gap metabolic acidosis, hematuria, persistent leukocytosis.  Anion gap metabolic acidosis: Unclear etiology. Possibly 2/2 electrolyte disturbances arising from seizure-like activity though CK, Na, K are unremarkable.  -Check ABG -Check lactate & salicylates   Acute encephalopathy: Unclear etiology though bedside assessment concerning. Vitamin B12 unremarkable. Post-ictal state would be expected to resolve by now. Benzo withdrawal possible given interruption of dose. Cardiac etiology less likely given reassuring troponins and EKG findings. Leukocytosis  -Check brain MRI -Continue home Depakote & Ativan prn for seizures -Allow for permissive HTN (220/180) until CVA ruled out -Neurology following, appreciate recs  Hyponatremia: Na 134, up from 128.  -Continue NS @ 75cc/hr x 12 hr   Hematuria: Possibly related to rhabdo 2/2 seizures though inconsistent with CK and other lab results.  -Repeat UA  Persistent leukocytosis: WBC 12, up from 11 yesterday predominantly PMNs. Given her age, she has  had two temperatures >34F overnight which is concerning. Asymmetric leg swelling concerning for DVT. -Check venous Dupplex to r/o DVT   -Continue monitoring  Hypothyroidism: TSH normal. Continue home Synthroid.  OCD: Continue home psych meds.   Seizure disorder: As noted above.   #FEN:  -Diet: NPO  -NS @ 75cc/hr     Dispo: Disposition is deferred at this time, awaiting improvement of current medical problems.  Anticipated discharge in approximately 1-2 day(s).   The patient does have a current PCP Aggie Cosier(Balaji Desai, MD) and [facility] after [facility].  The patient does have transportation limitations that hinder transportation to clinic appointments.  .Services Needed at time of discharge: Y = Yes, Blank = No PT:   OT:   RN:   Equipment:   Other:     LOS: 1 day   Heywood Ilesushil Tobby Fawcett, MD 12/05/2013, 2:40 PM

## 2013-12-06 DIAGNOSIS — J189 Pneumonia, unspecified organism: Secondary | ICD-10-CM

## 2013-12-06 LAB — CBC
HCT: 35.9 % — ABNORMAL LOW (ref 36.0–46.0)
HEMOGLOBIN: 12.9 g/dL (ref 12.0–15.0)
MCH: 32.3 pg (ref 26.0–34.0)
MCHC: 35.9 g/dL (ref 30.0–36.0)
MCV: 90 fL (ref 78.0–100.0)
Platelets: 195 10*3/uL (ref 150–400)
RBC: 3.99 MIL/uL (ref 3.87–5.11)
RDW: 13.2 % (ref 11.5–15.5)
WBC: 9.2 10*3/uL (ref 4.0–10.5)

## 2013-12-06 LAB — BASIC METABOLIC PANEL
Anion gap: 13 (ref 5–15)
Anion gap: 14 (ref 5–15)
BUN: 17 mg/dL (ref 6–23)
BUN: 13 mg/dL (ref 6–23)
CHLORIDE: 91 meq/L — AB (ref 96–112)
CO2: 25 mEq/L (ref 19–32)
CO2: 22 mEq/L (ref 19–32)
CREATININE: 0.6 mg/dL (ref 0.50–1.10)
Calcium: 9 mg/dL (ref 8.4–10.5)
Calcium: 9.1 mg/dL (ref 8.4–10.5)
Chloride: 95 mEq/L — ABNORMAL LOW (ref 96–112)
Creatinine, Ser: 0.71 mg/dL (ref 0.50–1.10)
GFR calc Af Amer: >90 mL/min (ref >90)
GFR calc Af Amer: >90 mL/min (ref >90)
GFR calc non Af Amer: 89 mL/min — ABNORMAL LOW (ref >90)
GFR, EST NON AFRICAN AMERICAN: 84 mL/min — AB (ref >90)
GLUCOSE: 97 mg/dL (ref 70–99)
GLUCOSE: 122 mg/dL — AB (ref 70–99)
Potassium: 2.7 mEq/L — CL (ref 3.7–5.3)
Potassium: 3.5 mEq/L — ABNORMAL LOW (ref 3.7–5.3)
Sodium: 129 mEq/L — ABNORMAL LOW (ref 137–147)
Sodium: 131 mEq/L — ABNORMAL LOW (ref 137–147)

## 2013-12-06 MED ORDER — INFLUENZA VAC SPLIT QUAD 0.5 ML IM SUSY
0.5000 mL | PREFILLED_SYRINGE | INTRAMUSCULAR | Status: AC
  Administered 2013-12-07: 0.5 mL via INTRAMUSCULAR
  Filled 2013-12-06: qty 0.5

## 2013-12-06 MED ORDER — POTASSIUM CHLORIDE 10 MEQ/100ML IV SOLN
10.0000 meq | INTRAVENOUS | Status: AC
  Administered 2013-12-06 (×3): 10 meq via INTRAVENOUS
  Filled 2013-12-06 (×2): qty 100

## 2013-12-06 MED ORDER — POTASSIUM CHLORIDE 10 MEQ/100ML IV SOLN
10.0000 meq | INTRAVENOUS | Status: AC
  Administered 2013-12-06 (×4): 10 meq via INTRAVENOUS
  Filled 2013-12-06 (×4): qty 100

## 2013-12-06 MED ORDER — ALPRAZOLAM 0.5 MG PO TABS
0.5000 mg | ORAL_TABLET | Freq: Three times a day (TID) | ORAL | Status: DC
  Administered 2013-12-06 – 2013-12-10 (×13): 0.5 mg via ORAL
  Filled 2013-12-06 (×13): qty 1

## 2013-12-06 MED ORDER — PNEUMOCOCCAL VAC POLYVALENT 25 MCG/0.5ML IJ INJ
0.5000 mL | INJECTION | INTRAMUSCULAR | Status: AC
  Administered 2013-12-07: 0.5 mL via INTRAMUSCULAR
  Filled 2013-12-06 (×2): qty 0.5

## 2013-12-06 MED ORDER — DEXTROSE-NACL 5-0.9 % IV SOLN
INTRAVENOUS | Status: AC
  Administered 2013-12-06 – 2013-12-07 (×2): via INTRAVENOUS

## 2013-12-06 NOTE — Progress Notes (Signed)
Subjective: No overnight events. Resting comfortably. More alert this morning. EEG completed and shows triphasic waves consistent with a metabolic etiology.  Attempted to contact family for LP consent multiple times on 10/31 with no success. Will re-attempt today.   History: Amber Pierce is an 73 y.o. female female presenting with seizures. The history is provided by the patient and a relative. The history is limited by the condition of the patient (Altered mental status).She was found by her sister with altered mentation and this morning. She was in a chair with snoring respirations and not responding. This lasted about 10 minutes. There was fecal incontinence but no bit lip or tongue. His sister is concerned that she may have had a seizure. After 10 minutes, she did become responsive but since then she has had fluctuating levels of mental status. She is slow to answer questions and does not follow commands consistently. She is also demonstrated perseveration of speech she apparently has had several similar episodes the last several days and was seen at [facility] yesterday at which time she was reported to have normal labs and normal CT scan. She does have history of seizure disorder for which she takes divalproex. There's been no known fever or chills. Family notes   Objective: Current vital signs: BP 137/54 mmHg  Pulse 62  Temp(Src) 97.9 F (36.6 C) (Oral)  Resp 18  Wt 49.487 kg (109 lb 1.6 oz)  SpO2 98% Vital signs in last 24 hours: Temp:  [97.5 F (36.4 C)-98.5 F (36.9 C)] 97.9 F (36.6 C) (11/01 4098) Pulse Rate:  [51-72] 62 (11/01 0220) Resp:  [18-20] 18 (11/01 1191) BP: (98-209)/(36-98) 137/54 mmHg (11/01 0613) SpO2:  [95 %-98 %] 98 % (11/01 0613)  Intake/Output from previous day:   Intake/Output this shift:   Nutritional status: Diet NPO time specified Except for: Sips with Meds  Neurologic Exam: Mental Status:  Eyes open, lethargic, requires stimulation to  stay awake. Oriented to name, [facility] and 2015. Intermittently following simple one step commands Cranial Nerves:  III,IV, VI: ptosis not present,eyes midline, no gaze preference, does not track examiner V,VII: smile symmetric, facial light touch sensation normal bilaterally  VIII: hearing normal bilaterally  IX,X: gag reflex present  XI: trapezius strength/neck flexion strength normal bilaterally  XII: tongue strength normal  Motor:  Unable to do formal motor exam but appears to move all extremities symmetrically and against light resistance . Minimal spontaneous movement noted Tone and bulk:normal tone throughout; no atrophy noted  Sensory: Pinprick and light touch intact throughout, bilaterally  Deep Tendon Reflexes: 2+ and symmetric throughout  Plantars:  Right: downgoing Left: downgoing  Cerebellar:  Unable to test Gait: deferred due to mental status   Lab Results: Basic Metabolic Panel:  Recent Labs Lab 12/04/13 1512 12/05/13 0508 12/06/13 0700  NA 128* 134* 129*  K 3.7 3.9 2.7*  CL 87* 93* 91*  CO2 25 23 25   GLUCOSE 99 104* 97  BUN 16 11 17   CREATININE 0.59 0.64 0.71  CALCIUM 10.4 9.6 9.0    Liver Function Tests:  Recent Labs Lab 12/04/13 1512  AST 45*  ALT 29  ALKPHOS 80  BILITOT 0.5  PROT 8.4*  ALBUMIN 4.4   No results for input(s): LIPASE, AMYLASE in the last 168 hours.  Recent Labs Lab 12/04/13 2010  AMMONIA 20    CBC:  Recent Labs Lab 12/04/13 1512 12/05/13 0508 12/06/13 0700  WBC 11.1* 12.0* 9.2  NEUTROABS 9.0*  --   --  HGB 14.0 12.6 12.9  HCT 39.7 36.4 35.9*  MCV 90.8 92.6 90.0  PLT 204 198 195    Cardiac Enzymes:  Recent Labs Lab 12/04/13 2010 12/05/13 0508 12/05/13 1153  CKTOTAL 142  --   --   TROPONINI <0.30 <0.30 <0.30    Lipid Panel: No results for input(s): CHOL, TRIG, HDL, CHOLHDL, VLDL, LDLCALC in the last 168 hours.  CBG:  Recent Labs Lab 12/04/13 1420  GLUCAP 119*    Microbiology: Results for  orders placed or performed during the hospital encounter of 03/16/12  MRSA PCR Screening     Status: Abnormal   Collection Time: 03/16/12  6:37 PM  Result Value Ref Range Status   MRSA by PCR POSITIVE (A) NEGATIVE Final    Comment:        The GeneXpert MRSA Assay (FDA approved for NASAL specimens only), is one component of a comprehensive MRSA colonization surveillance program. It is not intended to diagnose MRSA infection nor to guide or monitor treatment for MRSA infections. RESULT CALLED TO, READ BACK BY AND VERIFIED WITH: OBASOGIE,P RN 2032 03/16/12 MITCHELL,L    Coagulation Studies: No results for input(s): LABPROT, INR in the last 72 hours.  Imaging: Dg Chest 2 View  12/05/2013   CLINICAL DATA:  Cough  EXAM: CHEST  2 VIEW  COMPARISON:  Radiograph 12/04/2013  FINDINGS: Normal cardiac silhouette. Lungs are hyperinflated. Patient rotated slightly rightward which exaggerates the right hilum. On the lateral projection there is opacity over the heart suggesting lingular pneumonia. No pneumothorax.  IMPRESSION: Concern for lingular pneumonia.   Electronically Signed   By: Genevive BiStewart  Edmunds M.D.   On: 12/05/2013 10:14   Ct Head Wo Contrast  12/04/2013   CLINICAL DATA:  73 year old female, found down, unresponsive. Possible seizure this morning. Initial encounter.  EXAM: CT HEAD WITHOUT CONTRAST  TECHNIQUE: Contiguous axial images were obtained from the base of the skull through the vertex without intravenous contrast.  COMPARISON:  Head CT without contrast 03/16/2012.  FINDINGS: Sequelae of upper cervical posterior fusion hardware placement re- identified. Stable visualized osseous structures. No acute orbit or scalp soft tissue findings. Visualized paranasal sinuses and mastoids are clear.  Calcified atherosclerosis at the skull base. Stable cerebral volume. Mild ventricular prominence unchanged and felt to be physiologic for this patient. Patchy and confluent chronic cerebral white matter  hypodensity is stable. No midline shift, mass effect, or evidence of intracranial mass lesion. No acute intracranial hemorrhage identified. No evidence of cortically based acute infarction identified. No suspicious intracranial vascular hyperdensity.  IMPRESSION: No acute intracranial abnormality. Chronic nonspecific white matter changes.   Electronically Signed   By: Augusto GambleLee  Hall M.D.   On: 12/04/2013 16:05   Mr Brain Wo Contrast  12/05/2013   CLINICAL DATA:  Altered mental status  EXAM: MRI HEAD WITHOUT CONTRAST  TECHNIQUE: Multiplanar, multiecho pulse sequences of the brain and surrounding structures were obtained without intravenous contrast.  COMPARISON:  CT head 12/04/2013  FINDINGS: Mild atrophy. Mild ventricular enlargement consistent with atrophy. Pituitary normal in size. Craniocervical junction normal.  Negative for acute infarct. Mild to moderate chronic microvascular ischemic changes in the white matter and pons. Small chronic infarct right lateral cerebellum.  Negative for hemorrhage or mass lesion.  Paranasal sinuses are clear.  Fusion hardware in the upper cervical spine.  IMPRESSION: Atrophy and chronic microvascular ischemia.  No acute abnormality.   Electronically Signed   By: Marlan Palauharles  Clark M.D.   On: 12/05/2013 17:07   Dg Chest  Port 1 View  12/04/2013   CLINICAL DATA:  Subsequent encounter for seizure activity this morning.  EXAM: PORTABLE CHEST - 1 VIEW  COMPARISON:  05/25/2011.  FINDINGS: 1709 hrs. Lungs are clear without focal airspace consolidation, edema, or pleural effusion. The cardiopericardial silhouette is within normal limits for size. Imaged bony structures of the thorax are intact. Telemetry leads overlie the chest.  IMPRESSION: Stable.  No acute findings   Electronically Signed   By: Kennith CenterEric  Mansell M.D.   On: 12/04/2013 17:19    Medications:  Scheduled: . ALPRAZolam  1 mg Oral TID  . amLODipine  10 mg Oral Daily  . azithromycin  500 mg Intravenous Q24H  . cefTRIAXone  (ROCEPHIN)  IV  1 g Intravenous Q24H  . divalproex  500 mg Oral Daily  . enoxaparin (LOVENOX) injection  40 mg Subcutaneous Q24H  . hydroxychloroquine  400 mg Oral Daily  . levothyroxine  25 mcg Oral QAC breakfast  . metoprolol  50 mg Oral Daily  . mirtazapine  15 mg Oral QHS  . pantoprazole  40 mg Oral Daily  . sertraline  50 mg Oral Daily    Assessment/Plan:  73y/o woman with known seizure disorder presenting for breakthrough seizure with continued AMS. Continues to have AMS but based on exam she does not appear to be in subclinical status at this time. Mild leukocytosis with unclear source. Family notes productive cough but CXR unremarkable. Most recent VPA level therapeutic at 87.4.   Etiology of her encephalopathy remains unclear. Suspect it is likely multifactorial. EEG nonspecific with slowing and occasional triphasic waves.  -afebrile with improved leukocytosis but with unclear etiology would still plan for LP today. Will again attempt to try and get consent from faily -continue VPA 500mg  daily. Ativan prn for breakthrough seizures.  -correction of metabolic abnormalities per primary team    LOS: 2 days   Elspeth Choeter Brailon Don, DO [facility] [phone]  If 7pm- 7am, please page neurology on call as listed in AMION. 12/06/2013  8:23 AM

## 2013-12-06 NOTE — Evaluation (Addendum)
Clinical/Bedside Swallow Evaluation Patient Details  Name: Amber Pierce MRN: [id number] Date of Birth: [birth year]  Today's Date: 12/06/2013 Time: 1009-1023 SLP Time Calculation (min): 14 min  Past Medical History:  Past Medical History  Diagnosis Date  . Hypertension   . Thyroid disease   . Seizures   . Arthritis   . GERD (gastroesophageal reflux disease)   . Anxiety    Past Surgical History: History reviewed. No pertinent past surgical history. HPI:  73 year old female with h/o partial seizure, GERD, anxiety, OCD, hypothyroidism admitted with altered mental status. MRI Atrophy and chronic microvascular ischemia. No acute abnormality.  CXR Concern for lingular pneumonia.   Assessment / Plan / Recommendation Clinical Impression  Pt. exhibits a cognitively based min-mild oral and pharyngeal dysphagia due to acute illness.  No s/s penetration or aspiration exhibited .  Decreased oral seal and suspected delayed swallow initiation requiring moderate verbal/tactile cues for self feeding and general swallow precautions. Increased aspiration risk presently with pt.'s confusion and full supervision recommended with Dys 3 diet texture. thin  liquids, pills whole in applesauce and ST follow up for safety and efficiency.    Aspiration Risk  Moderate    Diet Recommendation Dysphagia 3 (Mechanical Soft);Thin liquid   Liquid Administration via: Cup;Straw Medication Administration: Whole meds with puree Supervision: Patient able to self feed;Full supervision/cueing for compensatory strategies Compensations: Slow rate;Small sips/bites Postural Changes and/or Swallow Maneuvers: Seated upright 90 degrees    Other  Recommendations Oral Care Recommendations: Oral care BID   Follow Up Recommendations   (TBD)    Frequency and Duration min 2x/week  2 weeks   Pertinent Vitals/Pain No pain         Swallow Study         Oral/Motor/Sensory Function Overall Oral Motor/Sensory Function:   (decreased strength/ROM)   Ice Chips Ice chips: Not tested   Thin Liquid Thin Liquid: Impaired Presentation: Cup;Straw Oral Phase Impairments: Reduced labial seal Pharyngeal  Phase Impairments: Suspected delayed Swallow    Nectar Thick Nectar Thick Liquid: Not tested   Honey Thick Honey Thick Liquid: Not tested   Puree Puree: Within functional limits   Solid   GO    Solid: Impaired Oral Phase Functional Implications:  (mild delayed transit)       Sania Noy, Breck CoonsLisa Willis 12/06/2013,10:40 AM  Breck CoonsLisa Willis Lonell FaceLitaker M.Ed [employer] Pager [phone]

## 2013-12-06 NOTE — Progress Notes (Signed)
Critical K+ called to writer MD paged.

## 2013-12-06 NOTE — Progress Notes (Addendum)
Subjective: Pt deeply asleep , did not respond to voice or tbut with sternal rub, woke up, an responded to most questions appropriaitely. No complaints.  Objective: Vital signs in last 24 hours: Filed Vitals:   12/05/13 1741 12/05/13 2320 12/06/13 0220 12/06/13 0613  BP: 123/55 98/36 132/48 137/54  Pulse: 72 51 62   Temp: 98.2 F (36.8 C) 97.5 F (36.4 C) 97.5 F (36.4 C) 97.9 F (36.6 C)  TempSrc: Oral Oral Oral Oral  Resp: 20 18 18 18   Weight:      SpO2: 98% 98% 97% 98%   Weight change:   Intake/Output Summary (Last 24 hours) at 12/06/13 1026 Last data filed at 12/05/13 1300  Gross per 24 hour  Intake      0 ml  Output      0 ml  Net      0 ml   Physical EXam-  General: deeply asleep, responded to sternal rub, slighly drowsy HEENT: Corneal opacity- right eye, left eye- reactive to light, PERRL, EOMI, no scleral icterus  Cardiac: RRR, no rubs, murmurs or gallops  Pulm: clear to auscultation bilaterally, no wheezes, rales, or rhonchi  Abd: soft, nontender, nondistended, BS present  Ext: warm and well perfused, 2+ pitting edema BLE with swelling R > L Neuro: Awake, orieneted to person, said she was at [facility].   Lab Results: Basic Metabolic Panel:  Recent Labs Lab 12/05/13 0508 12/06/13 0700  NA 134* 129*  K 3.9 2.7*  CL 93* 91*  CO2 23 25  GLUCOSE 104* 97  BUN 11 17  CREATININE 0.64 0.71  CALCIUM 9.6 9.0   CBC:  Recent Labs Lab 12/04/13 1512 12/05/13 0508 12/06/13 0700  WBC 11.1* 12.0* 9.2  NEUTROABS 9.0*  --   --   HGB 14.0 12.6 12.9  HCT 39.7 36.4 35.9*  MCV 90.8 92.6 90.0  PLT 204 198 195   Cardiac Enzymes:  Recent Labs Lab 12/04/13 2010 12/05/13 0508 12/05/13 1153  CKTOTAL 142  --   --   TROPONINI <0.30 <0.30 <0.30   Urinalysis:  Recent Labs Lab 12/04/13 1525  COLORURINE YELLOW  LABSPEC 1.018  PHURINE 6.5  GLUCOSEU NEGATIVE  HGBUR MODERATE*  BILIRUBINUR NEGATIVE  KETONESUR 15*  PROTEINUR NEGATIVE    UROBILINOGEN 1.0  NITRITE NEGATIVE  LEUKOCYTESUR NEGATIVE   Studies/Results: Dg Chest 2 View  12/05/2013   CLINICAL DATA:  Cough  EXAM: CHEST  2 VIEW  COMPARISON:  Radiograph 12/04/2013  FINDINGS: Normal cardiac silhouette. Lungs are hyperinflated. Patient rotated slightly rightward which exaggerates the right hilum. On the lateral projection there is opacity over the heart suggesting lingular pneumonia. No pneumothorax.  IMPRESSION: Concern for lingular pneumonia.   Electronically Signed   By: Genevive BiStewart  Edmunds M.D.   On: 12/05/2013 10:14   Ct Head Wo Contrast  12/04/2013   CLINICAL DATA:  73 year old female, found down, unresponsive. Possible seizure this morning. Initial encounter.  EXAM: CT HEAD WITHOUT CONTRAST  TECHNIQUE: Contiguous axial images were obtained from the base of the skull through the vertex without intravenous contrast.  COMPARISON:  Head CT without contrast 03/16/2012.  FINDINGS: Sequelae of upper cervical posterior fusion hardware placement re- identified. Stable visualized osseous structures. No acute orbit or scalp soft tissue findings. Visualized paranasal sinuses and mastoids are clear.  Calcified atherosclerosis at the skull base. Stable cerebral volume. Mild ventricular prominence unchanged and felt to be physiologic for this patient. Patchy and confluent chronic cerebral white matter hypodensity is stable.  No midline shift, mass effect, or evidence of intracranial mass lesion. No acute intracranial hemorrhage identified. No evidence of cortically based acute infarction identified. No suspicious intracranial vascular hyperdensity.  IMPRESSION: No acute intracranial abnormality. Chronic nonspecific white matter changes.   Electronically Signed   By: Augusto Gamble M.D.   On: 12/04/2013 16:05   Mr Brain Wo Contrast  12/05/2013   CLINICAL DATA:  Altered mental status  EXAM: MRI HEAD WITHOUT CONTRAST  TECHNIQUE: Multiplanar, multiecho pulse sequences of the brain and surrounding  structures were obtained without intravenous contrast.  COMPARISON:  CT head 12/04/2013  FINDINGS: Mild atrophy. Mild ventricular enlargement consistent with atrophy. Pituitary normal in size. Craniocervical junction normal.  Negative for acute infarct. Mild to moderate chronic microvascular ischemic changes in the white matter and pons. Small chronic infarct right lateral cerebellum.  Negative for hemorrhage or mass lesion.  Paranasal sinuses are clear.  Fusion hardware in the upper cervical spine.  IMPRESSION: Atrophy and chronic microvascular ischemia.  No acute abnormality.   Electronically Signed   By: Marlan Palau M.D.   On: 12/05/2013 17:07   Dg Chest Port 1 View  12/04/2013   CLINICAL DATA:  Subsequent encounter for seizure activity this morning.  EXAM: PORTABLE CHEST - 1 VIEW  COMPARISON:  05/25/2011.  FINDINGS: 1709 hrs. Lungs are clear without focal airspace consolidation, edema, or pleural effusion. The cardiopericardial silhouette is within normal limits for size. Imaged bony structures of the thorax are intact. Telemetry leads overlie the chest.  IMPRESSION: Stable.  No acute findings   Electronically Signed   By: Kennith Center M.D.   On: 12/04/2013 17:19   Medications: I have reviewed the patient's current medications. Scheduled Meds: . ALPRAZolam  1 mg Oral TID  . amLODipine  10 mg Oral Daily  . azithromycin  500 mg Intravenous Q24H  . cefTRIAXone (ROCEPHIN)  IV  1 g Intravenous Q24H  . divalproex  500 mg Oral Daily  . enoxaparin (LOVENOX) injection  40 mg Subcutaneous Q24H  . hydroxychloroquine  400 mg Oral Daily  . levothyroxine  25 mcg Oral QAC breakfast  . metoprolol  50 mg Oral Daily  . mirtazapine  15 mg Oral QHS  . pantoprazole  40 mg Oral Daily  . potassium chloride  10 mEq Intravenous Q1 Hr x 4  . sertraline  50 mg Oral Daily   Continuous Infusions:   PRN Meds:.acetaminophen **OR** acetaminophen, diphenoxylate-atropine, hydrALAZINE, LORazepam,  oxyCODONE Assessment/Plan: Active Problems:   Altered mental status   Hypothyroidism   Hyponatremia   Acute encephalopathy  Amber Pierce is a 73 year old female with seizure disorder, OCD, hypothyroidism hospitalized for acute encephalopathy now with improved hyponatremia but anion gap metabolic acidosis, hematuria, persistent leukocytosis.  Acute encephalopathy: ? Septic/metabolic etiology. Workup including MRI- negative. No infarcts. Neuro plans to do LP, challenge with getting consult. - Follow up LP results when done. - reduce home xanax form 1mg  to 0.5mg .   - Continue home Depakote & Ativan prn for seizures - Neurology following, appreciate recs. - Cont home antiHTN  CAP- With Cough- productive of greenish sputum, initial leukocytosis, and temp max- 99.5 this admission. Chest xray repeat- with concern for lingular PNA. Will treat. Sputum culture not collected. - Day 1 Azithro and ceftriazone - CBC in the Am  Electrolyte abnormalities-  - Hyponatremia-  129 this am. Was on N/s D5- at 75cc/hr. Likely related to dehydration. Will Restart IVF- D5NS at 75 cc/hr for 24hrs and reaccess.  -  Hypokalemia- K- 2.7, Give IV KCL- 40mg  - Strict in and out - Bmet later today and in the am.  Hematuria: Possibly related to rhabdo 2/2 seizures though inconsistent with CK and other lab results.  -Repeat UA  LLE swelling- Likely pedal edema, Dopplers negative for clot. - Continue to monitor.   Anion gap metabolic acidosis: Resolved. Likely related to seizure activity. Work up negative.  - Monitor   Hypothyroidism: TSH normal. Continue home Synthroid.  OCD: Continue home psych meds. - Reduce home Alanson AlyXanaz to 0.5mg  daily   Seizure disorder: As noted above.  - Will reduce xanax dose to 0.5 mg daily, giving on going AMS. - PRN ativan  #FEN:  -Diet: Dysphagia 3 diet, Speech eval done. -D5NS @ 75cc/hr  Dispo: Disposition is deferred at this time, awaiting improvement of current medical  problems.  Anticipated discharge in approximately 1-2 day(s).   The patient does have a current PCP Aggie Cosier(Balaji Desai, MD) and [facility] after discharge.  The patient does have transportation limitations that hinder transportation to clinic appointments.  .Services Needed at time of discharge: Y = Yes, Blank = No PT:   OT:   RN:   Equipment:   Other:     LOS: 2 days   Onnie BoerEjiroghene E Emokpae, MD 12/06/2013, 10:26 AM

## 2013-12-06 NOTE — Progress Notes (Signed)
Pt seen and examined. Please refer to resident note for details.   Pt is AAO*2 today- much improved MS today. EEG with findings consistent with encephelopathy. MRI with no acute CVA.  Plan: - Neuro following. Will do LP today. Will follow up results - c/w depakote and ativan prn - Pt with likely encephelopathy secondary to underlying pneumonia - c/w ceftriaxone, azithromycin - Leukocytosis resolved. Wll monitor - Supplement K and recheck renal panel today - Restarted on IVF. Pt with likely hypovolemic hyponatremia. Will follow up renal panel - Repeat u/a today to follow up on hematuria

## 2013-12-06 NOTE — Progress Notes (Signed)
Second contact MD paged.

## 2013-12-07 DIAGNOSIS — G40909 Epilepsy, unspecified, not intractable, without status epilepticus: Secondary | ICD-10-CM | POA: Diagnosis present

## 2013-12-07 DIAGNOSIS — E871 Hypo-osmolality and hyponatremia: Secondary | ICD-10-CM | POA: Diagnosis not present

## 2013-12-07 DIAGNOSIS — J189 Pneumonia, unspecified organism: Secondary | ICD-10-CM | POA: Diagnosis present

## 2013-12-07 LAB — BASIC METABOLIC PANEL
ANION GAP: 14 (ref 5–15)
Anion gap: 14 (ref 5–15)
BUN: 8 mg/dL (ref 6–23)
BUN: 11 mg/dL (ref 6–23)
CALCIUM: 9.3 mg/dL (ref 8.4–10.5)
CHLORIDE: 94 meq/L — AB (ref 96–112)
CO2: 25 mEq/L (ref 19–32)
CO2: 25 mEq/L (ref 19–32)
CREATININE: 0.51 mg/dL (ref 0.50–1.10)
CREATININE: 0.64 mg/dL (ref 0.50–1.10)
Calcium: 9.5 mg/dL (ref 8.4–10.5)
Chloride: 95 mEq/L — ABNORMAL LOW (ref 96–112)
GFR calc Af Amer: >90 mL/min (ref >90)
GFR calc non Af Amer: 87 mL/min — ABNORMAL LOW (ref >90)
Glucose, Bld: 102 mg/dL — ABNORMAL HIGH (ref 70–99)
Glucose, Bld: 112 mg/dL — ABNORMAL HIGH (ref 70–99)
Potassium: 3.2 mEq/L — ABNORMAL LOW (ref 3.7–5.3)
Potassium: 3.8 mEq/L (ref 3.7–5.3)
Sodium: 133 mEq/L — ABNORMAL LOW (ref 137–147)
Sodium: 134 mEq/L — ABNORMAL LOW (ref 137–147)

## 2013-12-07 LAB — CBC
HEMATOCRIT: 38.6 % (ref 36.0–46.0)
Hemoglobin: 13.8 g/dL (ref 12.0–15.0)
MCH: 32.4 pg (ref 26.0–34.0)
MCHC: 35.8 g/dL (ref 30.0–36.0)
MCV: 90.6 fL (ref 78.0–100.0)
Platelets: 238 10*3/uL (ref 150–400)
RBC: 4.26 MIL/uL (ref 3.87–5.11)
RDW: 13.2 % (ref 11.5–15.5)
WBC: 9.4 10*3/uL (ref 4.0–10.5)

## 2013-12-07 LAB — URINALYSIS, ROUTINE W REFLEX MICROSCOPIC
BILIRUBIN URINE: NEGATIVE
Glucose, UA: NEGATIVE mg/dL
Ketones, ur: NEGATIVE mg/dL
LEUKOCYTES UA: NEGATIVE
Nitrite: NEGATIVE
PH: 7 (ref 5.0–8.0)
Protein, ur: NEGATIVE mg/dL
Specific Gravity, Urine: 1.004 — ABNORMAL LOW (ref 1.005–1.030)
Urobilinogen, UA: 1 mg/dL (ref 0.0–1.0)

## 2013-12-07 LAB — URINE MICROSCOPIC-ADD ON

## 2013-12-07 LAB — MAGNESIUM: Magnesium: 1.8 mg/dL (ref 1.5–2.5)

## 2013-12-07 LAB — MRSA PCR SCREENING: MRSA by PCR: POSITIVE — AB

## 2013-12-07 MED ORDER — NICOTINE 7 MG/24HR TD PT24
7.0000 mg | MEDICATED_PATCH | Freq: Every day | TRANSDERMAL | Status: DC
  Administered 2013-12-07 – 2013-12-10 (×4): 7 mg via TRANSDERMAL
  Filled 2013-12-07 (×4): qty 1

## 2013-12-07 MED ORDER — POTASSIUM CHLORIDE CRYS ER 20 MEQ PO TBCR
40.0000 meq | EXTENDED_RELEASE_TABLET | Freq: Once | ORAL | Status: AC
  Administered 2013-12-07: 40 meq via ORAL
  Filled 2013-12-07: qty 2

## 2013-12-07 MED ORDER — CHLORHEXIDINE GLUCONATE CLOTH 2 % EX PADS
6.0000 | MEDICATED_PAD | Freq: Every day | CUTANEOUS | Status: DC
  Administered 2013-12-07 – 2013-12-10 (×4): 6 via TOPICAL

## 2013-12-07 MED ORDER — MAGNESIUM SULFATE IN D5W 10-5 MG/ML-% IV SOLN
1.0000 g | Freq: Once | INTRAVENOUS | Status: AC
  Administered 2013-12-07: 1 g via INTRAVENOUS
  Filled 2013-12-07: qty 100

## 2013-12-07 MED ORDER — MUPIROCIN 2 % EX OINT
1.0000 "application " | TOPICAL_OINTMENT | Freq: Two times a day (BID) | CUTANEOUS | Status: DC
  Administered 2013-12-07 – 2013-12-10 (×6): 1 via NASAL
  Filled 2013-12-07: qty 22

## 2013-12-07 MED ORDER — POTASSIUM CHLORIDE 10 MEQ/100ML IV SOLN
10.0000 meq | INTRAVENOUS | Status: AC
Start: 2013-12-07 — End: 2013-12-07
  Administered 2013-12-07 (×4): 10 meq via INTRAVENOUS
  Filled 2013-12-07 (×2): qty 100

## 2013-12-07 NOTE — Evaluation (Signed)
Physical Therapy Evaluation Patient Details Name: Amber PalauLynda Young Evans MRN: [id number] DOB: Jan 04, 1941 Today's Date: 12/07/2013   History of Present Illness  Patient is a 73 y/o female admitted with AMS. From MD note, per sister, pt was found this morning leaning to one side and was incoherent for 10 minutes, consistent with prior post-ictal states. She has several similar episodes over the past few days and [facility] though this one was more severe. She has a history of seizures, and she thinks this recent episode is related to her pain medication. Pt was hospitalized back in February 2014 and April 2013 for AMS, each time with concomitant electrolyte abnormalities and seizures. Chest XRAY- Concern for lingular pneumonia. MRI and CT head - unremarkable.     Clinical Impression  Patient presents with functional limitations due to deficits listed in PT problem list (see below). Pt not able to provide information regarding PLOF and level of assist needed at home PTA. No family members present. Pt lethargic with generalized weakness limiting transfers and functional mobility. Tolerated taking a few steps to chair today with Min A of 2 for safety. Pt may not be safe to return home pending level of assist available. Pt would benefit from ST SNF to improve transfers, gait, balance and overall mobility so pt can maximize independence and ease burden of care prior to return home.     Follow Up Recommendations SNF;Supervision/Assistance - 24 hour    Equipment Recommendations  Other (comment)    Recommendations for Other Services OT consult     Precautions / Restrictions Precautions Precautions: Fall Precaution Comments: Seizure precautions. Restrictions Weight Bearing Restrictions: No      Mobility  Bed Mobility Overal bed mobility: Needs Assistance Bed Mobility: Rolling;Sidelying to Sit Rolling: Min assist Sidelying to sit: Max assist       General bed mobility comments:  Rolling to R/L with Min A to initiate movement and to bring UE to hand rail. Max A to elevate trunk and bring BLEs to EOB. Cues for step by step sequence to perform transfer. Pt able to assist minimally.  Transfers Overall transfer level: Needs assistance Equipment used: Rolling walker (2 wheeled) Transfers: Sit to/from Stand Sit to Stand: Mod assist         General transfer comment: Stood from EOB x1 with VC's for anterior translation and to rise. Manual cues for upright, hip extension. Pt with severe anterior trunk lean with downward gaze.  Ambulation/Gait Ambulation/Gait assistance: Min assist;+2 physical assistance Ambulation Distance (Feet): 3 Feet Assistive device: Rolling walker (2 wheeled) Gait Pattern/deviations: Step-to pattern;Narrow base of support;Trunk flexed;Leaning posteriorly Gait velocity: slow Gait velocity interpretation: <1.8 ft/sec, indicative of risk for recurrent falls General Gait Details: Pt with downward gaze and posterior lean through hips. Requires verbal/manual cues for upright posture and Min A to navigate RW and for forward momentum/balance. Very unsteady. Tech assisted with IV pole and balance at times.   Stairs            Wheelchair Mobility    Modified Rankin (Stroke Patients Only)       Balance Overall balance assessment: Needs assistance Sitting-balance support: Feet supported Sitting balance-Leahy Scale: Poor Sitting balance - Comments: Able to sit EOB ~15 sec unsupported x2 then LOB anteriorly and to the right. min A to regain balance and prevent fall.   Standing balance support: During functional activity Standing balance-Leahy Scale: Poor Standing balance comment: Requires BUE support on RW for balance/safety and Min A-Mod A from  therapist to maintain upright. More assist required from therapist during dynamic activities and gait.                             Pertinent Vitals/Pain Pain Assessment: Faces Faces Pain  Scale: No hurt    Home Living Family/patient expects to be discharged to:: Unsure Living Arrangements: Other relatives (Reports she lives with sister and niece.) Available Help at Discharge: Family;Available 24 hours/day Type of Home: House Home Access: Level entry     Home Layout: Two level;Able to live on main level with bedroom/bathroom Home Equipment: Walker - 2 wheels      Prior Function Level of Independence: Needs assistance         Comments: Unsure of pt's PLOF or level of assist needed at home as pt not able to answer questions appropriately. No family members present.     Hand Dominance        Extremity/Trunk Assessment   Upper Extremity Assessment: Generalized weakness           Lower Extremity Assessment: Generalized weakness (Able to perform SLR minimally with BLEs. Ankle AROM WFL. however functionally pt with weakness during standing, gait. Not able to obtain full upright in hip extension or knee extension. Hip adductor tightness noted.)         Communication   Communication: No difficulties  Cognition Arousal/Alertness: Lethargic Behavior During Therapy: WFL for tasks assessed/performed Overall Cognitive Status: No family/caregiver present to determine baseline cognitive functioning       Memory: Decreased short-term memory              General Comments      Exercises General Exercises - Lower Extremity Straight Leg Raises: Both;5 reps;Seated (long sitting in recliner.)      Assessment/Plan    PT Assessment Patient needs continued PT services  PT Diagnosis Difficulty walking;Generalized weakness   PT Problem List Decreased strength;Decreased cognition;Decreased activity tolerance;Decreased knowledge of use of DME;Decreased balance;Decreased safety awareness;Decreased mobility  PT Treatment Interventions Balance training;Gait training;DME instruction;Neuromuscular re-education;Cognitive remediation;Patient/family  education;Functional mobility training;Therapeutic activities;Therapeutic exercise   PT Goals (Current goals can be found in the Care Plan section) Acute Rehab PT Goals Patient Stated Goal: "I am mad, I want to go home!" PT Goal Formulation: With patient Time For Goal Achievement: 12/21/13 Potential to Achieve Goals: Fair    Frequency Min 3X/week   Barriers to discharge Other (comment) not sure the level of support pt has at home. Reports she is home alone sometimes - not sure of accuracy?    Co-evaluation               End of Session Equipment Utilized During Treatment: Gait belt Activity Tolerance: Patient limited by fatigue Patient left: in chair;with call bell/phone within reach;with chair alarm set (Chair alarm not activating - tech to get a new chair alarm box.) Nurse Communication: Mobility status;Precautions         Time: 1000-1020 PT Time Calculation (min): 20 min   Charges:   PT Evaluation $Initial PT Evaluation Tier I: 1 Procedure PT Treatments $Therapeutic Activity: 8-22 mins   PT G CodesAlvie Heidelberg:          Folan, Jamacia Jester A 12/07/2013, 10:50 AM  Alvie HeidelbergShauna Folan, PT, DPT [phone]

## 2013-12-07 NOTE — Clinical Social Work Psychosocial (Signed)
Clinical Social Work Department BRIEF PSYCHOSOCIAL ASSESSMENT 12/07/2013  Patient:  Amber HudsonYOUNG EVANS,[name]     Account Number:  [id number]     Admit date:  12/04/2013  Clinical Social Worker:  Derenda FennelNIXON,Khalifa Knecht, CLINICAL SOCIAL WORKER  Date/Time:  12/07/2013 03:52 PM  Referred by:  Physician  Date Referred:  12/07/2013 Referred for  SNF Placement   Other Referral:   Interview type:  Other - See comment Other interview type:   CSW spoke with pt's sister, Drenda FreezeFran [facility]) at bedside.    PSYCHOSOCIAL DATA Living Status:  SIBLING Admitted from facility:   Level of care:   Primary support name:  Harvie BridgeFran Flemming [phone] Primary support relationship to patient:  SIBLING Degree of support available:   Strong    CURRENT CONCERNS Current Concerns  Post-Acute Placement   Other Concerns:    SOCIAL WORK ASSESSMENT / PLAN Clinical Social Worker spoke with pt's sister, Drenda FreezeFran at bedside in reference to post-acute placement for SNF. Pt's sister reported pt lives with her and that she would [place]. Pt's sister also stated pt will return home with her upon completion of SNF rehab. CSW to follow pt and continue to provide support and facilitate pt's discharge needs once medically stable.   Assessment/plan status:  Psychosocial Support/Ongoing Assessment of Needs Other assessment/ plan:   Information/referral to community resources:   SNF information provided.    PATIENT'S/FAMILY'S RESPONSE TO PLAN OF CARE: Pt sitting at bedside, alert and brushing her teeth. Pt also with AMS. Pt's sister agreeable to SNF and requesting SNF in [place].    Derenda FennelBashira Lonzy Mato, MSW, LCSWA [phone] 12/07/2013 4:02 PM

## 2013-12-07 NOTE — Clinical Social Work Placement (Signed)
Clinical Social Work Department CLINICAL SOCIAL WORK PLACEMENT NOTE 12/07/2013  Patient:  Amber Pierce,Amber Pierce  Account Number:  [id number] Admit date:  12/04/2013  Clinical Social Worker:  Derenda FennelBASHIRA Sinead Hockman, CLINICAL SOCIAL WORKER  Date/time:  12/07/2013 04:02 PM  Clinical Social Work is seeking post-discharge placement for this patient at the following level of care:   SKILLED NURSING   (*CSW will update this form in Epic as items are completed)   12/07/2013  Patient/family provided with [facility] of Clinical Social Work's list of facilities offering this level of care within the geographic area requested by the patient (or if unable, by the patient's family).  12/07/2013  Patient/family informed of their freedom to choose among providers that offer the needed level of care, that participate in Medicare, Medicaid or managed care program needed by the patient, have an available bed and are willing to accept the patient.  12/07/2013  Patient/family informed of [facility]' ownership interest in [facility], as well as of the fact that they are under no obligation to receive care at this facility.  PASARR submitted to EDS on  PASARR number received on   FL2 transmitted to all facilities in geographic area requested by pt/family on   FL2 transmitted to all facilities within larger geographic area on   Patient informed that his/her managed care company has contracts with or will negotiate with  certain facilities, including the following:     Patient/family informed of bed offers received:   Patient chooses bed at  Physician recommends and patient chooses bed at    Patient to be transferred to  on   Patient to be transferred to facility by  Patient and family notified of transfer on  Name of family member notified:    The following physician request were entered in Epic:   Additional Comments:   Derenda FennelBashira Kaelene Elliston, MSW, LCSWA [phone] 12/07/2013 4:03 PM

## 2013-12-07 NOTE — Discharge Summary (Signed)
Name: Amber Pierce MRN: [id number] DOB: 02/15/1940 73 y.o. PCP: Aggie Cosier, MD  Date of Admission: 12/04/2013  2:45 PM Date of Discharge: 12/10/2013 Attending Physician: Earl Lagos, MD  Discharge Diagnosis: Principal Problem:   Acute encephalopathy Active Problems:   Hypothyroidism   Hyponatremia   CAP (community acquired pneumonia)   Seizure disorder  Discharge Medications:   Medication List    TAKE these medications        ALPRAZolam 0.5 MG tablet  Commonly known as:  XANAX  Take 1 tablet (0.5 mg total) by mouth 3 (three) times daily.     amLODipine 10 MG tablet  Commonly known as:  NORVASC  Take 1 tablet (10 mg total) by mouth daily.     amoxicillin-clavulanate 875-125 MG per tablet  Commonly known as:  AUGMENTIN  Take 1 tablet by mouth every 12 (twelve) hours.     azithromycin 500 MG tablet  Commonly known as:  ZITHROMAX  Take 1 tablet (500 mg total) by mouth daily.     diphenoxylate-atropine 2.5-0.025 MG per tablet  Commonly known as:  LOMOTIL  Take 1 tablet by mouth 4 (four) times daily as needed for diarrhea or loose stools. For diarrhea     divalproex 500 MG DR tablet  Commonly known as:  DEPAKOTE  Take 500 mg by mouth daily.     fluvoxaMINE 100 MG tablet  Commonly known as:  LUVOX  Take 1 tablet (100 mg total) by mouth 2 (two) times daily.     HEMATINIC PLUS VIT/MINERALS PO  Take 1 tablet by mouth daily.     hydroxychloroquine 200 MG tablet  Commonly known as:  PLAQUENIL  Take 400 mg by mouth daily.     levothyroxine 25 MCG tablet  Commonly known as:  SYNTHROID, LEVOTHROID  Take 25 mcg by mouth daily.     metoprolol 50 MG tablet  Commonly known as:  LOPRESSOR  Take 50 mg by mouth daily.     mirtazapine 15 MG tablet  Commonly known as:  REMERON  Take 15 mg by mouth at bedtime.     omeprazole 20 MG capsule  Commonly known as:  PRILOSEC  Take 20 mg by mouth daily.     oxyCODONE 5 MG immediate release tablet  Commonly  known as:  Oxy IR/ROXICODONE  Take 1 tablet (5 mg total) by mouth every 8 (eight) hours as needed for moderate pain.     potassium chloride SA 20 MEQ tablet  Commonly known as:  K-DUR,KLOR-CON  Take 40 mEq by mouth daily.     promethazine 12.5 MG tablet  Commonly known as:  PHENERGAN  Take 12.5 mg by mouth every 6 (six) hours as needed for nausea or vomiting.     senna-docusate 8.6-50 MG per tablet  Commonly known as:  Senokot-S  Take 2 tablets by mouth 2 (two) times daily.     sertraline 50 MG tablet  Commonly known as:  ZOLOFT  Take 50 mg by mouth daily.     vitamin B-12 500 MCG tablet  Commonly known as:  CYANOCOBALAMIN  Take 500 mcg by mouth daily.        Disposition and follow-up:   Amber Pierce was discharged from [facility] in Stable condition.  At the hospital follow up visit please address:  1.  Dementia: assess for changes from baseline  2.  Electrolyte changes: low Na, K  3.  Hematuria: workup  4.  Labs / imaging needed  at time of follow-up: BMET, UA  5.  Pending labs/ test needing follow-up: none  Follow-up Appointments: Follow-up Information    Follow up with Aggie Cosier, MD On 12/14/2013.   Why:  [id number]   Contact information:   [facility] [address] [place] [address] [phone]       Discharge Instructions: Discharge Instructions    Diet - low sodium heart healthy    Complete by:  As directed      Increase activity slowly    Complete by:  As directed            Consultations:    Procedures Performed:  Dg Chest 2 View  12/05/2013   CLINICAL DATA:  Cough  EXAM: CHEST  2 VIEW  COMPARISON:  Radiograph 12/04/2013  FINDINGS: Normal cardiac silhouette. Lungs are hyperinflated. Patient rotated slightly rightward which exaggerates the right hilum. On the lateral projection there is opacity over the heart suggesting lingular pneumonia. No pneumothorax.  IMPRESSION: Concern for lingular pneumonia.    Electronically Signed   By: Genevive Bi M.D.   On: 12/05/2013 10:14   Ct Head Wo Contrast  12/04/2013   CLINICAL DATA:  73 year old female, found down, unresponsive. Possible seizure this morning. Initial encounter.  EXAM: CT HEAD WITHOUT CONTRAST  TECHNIQUE: Contiguous axial images were obtained from the base of the skull through the vertex without intravenous contrast.  COMPARISON:  Head CT without contrast 03/16/2012.  FINDINGS: Sequelae of upper cervical posterior fusion hardware placement re- identified. Stable visualized osseous structures. No acute orbit or scalp soft tissue findings. Visualized paranasal sinuses and mastoids are clear.  Calcified atherosclerosis at the skull base. Stable cerebral volume. Mild ventricular prominence unchanged and felt to be physiologic for this patient. Patchy and confluent chronic cerebral white matter hypodensity is stable. No midline shift, mass effect, or evidence of intracranial mass lesion. No acute intracranial hemorrhage identified. No evidence of cortically based acute infarction identified. No suspicious intracranial vascular hyperdensity.  IMPRESSION: No acute intracranial abnormality. Chronic nonspecific white matter changes.   Electronically Signed   By: Augusto Gamble M.D.   On: 12/04/2013 16:05   Mr Brain Wo Contrast  12/05/2013   CLINICAL DATA:  Altered mental status  EXAM: MRI HEAD WITHOUT CONTRAST  TECHNIQUE: Multiplanar, multiecho pulse sequences of the brain and surrounding structures were obtained without intravenous contrast.  COMPARISON:  CT head 12/04/2013  FINDINGS: Mild atrophy. Mild ventricular enlargement consistent with atrophy. Pituitary normal in size. Craniocervical junction normal.  Negative for acute infarct. Mild to moderate chronic microvascular ischemic changes in the white matter and pons. Small chronic infarct right lateral cerebellum.  Negative for hemorrhage or mass lesion.  Paranasal sinuses are clear.  Fusion hardware in the  upper cervical spine.  IMPRESSION: Atrophy and chronic microvascular ischemia.  No acute abnormality.   Electronically Signed   By: Marlan Palau M.D.   On: 12/05/2013 17:07   Dg Chest Port 1 View  12/04/2013   CLINICAL DATA:  Subsequent encounter for seizure activity this morning.  EXAM: PORTABLE CHEST - 1 VIEW  COMPARISON:  05/25/2011.  FINDINGS: 1709 hrs. Lungs are clear without focal airspace consolidation, edema, or pleural effusion. The cardiopericardial silhouette is within normal limits for size. Imaged bony structures of the thorax are intact. Telemetry leads overlie the chest.  IMPRESSION: Stable.  No acute findings   Electronically Signed   By: Kennith Center M.D.   On: 12/04/2013 17:19    Admission HPI: Amber Pierce  Logan Boresvans is a 73 year old female with h/o partial seizure, OCD, hypothyroidism who presents with altered mental status. Sister was present and contributed to the majority of the HPI.  Per sister, she was found this morning leaning to one side and was incoherent for 10 minutes, consistent with prior post-ictal states She has several similar episodes over the past few days and [facility] though this one was more severe. She has a history of seizures, and she thinks this recent episode is related to her pain medication. She normally takes 3 tablets of oxycodone for back pain, but she was running out and thus dropped to 2 tablets. To compensate, she took more Xanax (regular dose of 1mg  TID), but when she started to run out of her Xanax, she then took 0.5mg  TID. She had no Xanax today on the day of admission.   She was hospitalized back in February 2014 and April 2013 for altered mental status, each time with concomitant electrolyte abnormalities and seizures.   At bedside, she is minimally responsive to questions. She denies chest pain, shortness of breath, difficulty urinating. On exam, she coughed up green sputum.   Hospital Course by problem list: Principal Problem:    Acute encephalopathy Active Problems:   Hypothyroidism   Hyponatremia   CAP (community acquired pneumonia)   Seizure disorder   Acute encephalopathy: Likely multifactorial etiology (CAP & electrolyte abnormalities). Repeat CXR showed lingular infiltrate. She was started on a 7-day course of azithromycin IV and ceftriaxone IV which was then transitioned to azithromycin PO and Augmentin PO and is to be completed as outpatient. Na on admission was 129, K 2.7 and were corrected slowly with IV fluids before she could tolerate PO intake. She was recommended to continue Dysphagia 3 diet in the setting of a SNF per speech and PT evaluations, respectively. Her hyponatremia improved, so her sodium should be monitored daily such that it does not contribute to further changes in her mental status.  Seizure disorder: Neurology was consulted, and EEG workup was unremarkable for seizures. She was continued on home Depakote and had no seizures during her hospital stay.   Hematuria: Repeat UA from admission showed stable increased Hb and RBCs. She was never started on a Foley, so consider further workup as outpatient.  Hypothyroidism: TSH was 2.25 on admission. Stable on home Synthroid.   HTN: Her home meds were initially held for suspicion of CVA but restarted once CVA was ruled out. She remained normotensive during her hospital stay.   Discharge Vitals:   BP 147/75 mmHg  Pulse 89  Temp(Src) 98.7 F (37.1 C) (Oral)  Resp 18  Wt 110 lb 12.8 oz (50.259 kg)  SpO2 98%  Discharge Labs:  Results for orders placed or performed during the hospital encounter of 12/04/13 (from the past 24 hour(s))  Basic metabolic panel     Status: None   Collection Time: 12/10/13  5:34 AM  Result Value Ref Range   Sodium 140 137 - 147 mEq/L   Potassium 4.1 3.7 - 5.3 mEq/L   Chloride 104 96 - 112 mEq/L   CO2 22 19 - 32 mEq/L   Glucose, Bld 91 70 - 99 mg/dL   BUN 12 6 - 23 mg/dL   Creatinine, Ser 9.600.55 0.50 - 1.10 mg/dL    Calcium 9.1 8.4 - 45.410.5 mg/dL   GFR calc non Af Amer >90 >90 mL/min   GFR calc Af Amer >90 >90 mL/min   Anion gap 14 5 -  15    Signed: Heywood Ilesushil Octave Montrose, MD 12/10/2013, 2:57 PM    Services Ordered on Discharge: None Equipment Ordered on Discharge: None

## 2013-12-07 NOTE — Progress Notes (Signed)
Got a call from lab for a positive MRSA Screening, pt commenced on treatment and was put on contact isolation, will however continue to monitor. Obasogie-Asidi, Shani Fitch Efe

## 2013-12-07 NOTE — Progress Notes (Signed)
Speech Language Pathology Treatment: Dysphagia  Patient Details Name: Amber PalauLynda Young Evans MRN: [id number] DOB: 02/26/1940 Today's Date: 12/07/2013 Time: [id number] SLP Time Calculation (min): 13 min  Assessment / Plan / Recommendation Clinical Impression  Pt was seen for f/u dysphagia treatment with SLP providing skilled observation of regular textures and thin liquids. Pt was impulsive with intake despite Max multimodal cueing from SLP, requiring Total A for small, single bites. Pt reports that she is being "stubborn" today, and required Max encouragement to consume limited number of liquid trials. Thin liquid via straw sips consistently yielded a delayed cough, which was not observed when drinking by cup sips. Recommend that patient continue with Dys 3 textures and thin liquids, although without straws. Will continue to follow for tolerance.   HPI HPI: 73 year old female with h/o partial seizure, GERD, anxiety, OCD, hypothyroidism admitted with altered mental status. MRI Atrophy and chronic microvascular ischemia. No acute abnormality.  CXR Concern for lingular pneumonia.   Pertinent Vitals Pain Assessment: No/denies pain Faces Pain Scale: No hurt  SLP Plan  Continue with current plan of care    Recommendations Diet recommendations: Dysphagia 3 (mechanical soft);Thin liquid Liquids provided via: Cup;No straw Medication Administration: Whole meds with puree Supervision: Patient able to self feed;Full supervision/cueing for compensatory strategies Compensations: Slow rate;Small sips/bites Postural Changes and/or Swallow Maneuvers: Seated upright 90 degrees              Oral Care Recommendations: Oral care BID Follow up Recommendations: Other (comment) (TBD) Plan: Continue with current plan of care    GO      Maxcine HamLaura Paiewonsky, M.A. CCC-SLP [phone]  Maxcine Hamaiewonsky, Kuulei Kleier 12/07/2013, 11:48 AM

## 2013-12-07 NOTE — Progress Notes (Signed)
Patient ID: Amber PalauLynda Young Pierce, female   DOB: November 01, 1940, 73 y.o.   MRN: [id number] Medicine attending progress note: I personally interviewed and examined this patient today together with medical resident Dr. Heywood Ilesushil Patel and I concur with his evaluation and management plan.this is a 73 year old woman who presented with change in her mental status. She has an underlying seizure disorder.she has had low-grade fever and an early infiltrate seen on chest radiograph. Her mental status has improved with treatment of presumed community-acquired infection. At this point she does not appear to be capable of independent living. Our social work team is working to find a Engineer, manufacturing systemssuitable extended-care facilityclose to where her [place] lives in [place].

## 2013-12-07 NOTE — Progress Notes (Signed)
Subjective: This AM, she was eating breakfast when we saw her at bedside. She has no complaints otherwise. [place] [facility] and that the year is 2015 but does appear a bit disoriented.  Per sister, she appears better and is often disoriented/confused when she has changes in her electrolytes. PT recommended SNF, and she is amenable to it so [place] [facility] [place] where her nephew (patient's son) lives.   Objective: Vital signs in last 24 hours: Filed Vitals:   12/06/13 2142 12/07/13 0258 12/07/13 0639 12/07/13 1028  BP: 149/70 146/55 158/58 163/61  Pulse: 81 65 74 85  Temp: 98.2 F (36.8 C) 97.6 F (36.4 C) 97.7 F (36.5 C) 98.2 F (36.8 C)  TempSrc: Oral Oral Oral Oral  Resp: 18 18 18 20   Weight:  107 lb 12.8 oz (48.898 kg)    SpO2: 98% 100% 100% 99%   Weight change:   Intake/Output Summary (Last 24 hours) at 12/07/13 1213 Last data filed at 12/07/13 0900  Gross per 24 hour  Intake    360 ml  Output      0 ml  Net    360 ml   Physical Exam  General: alert and awake, sitting up in bed with breakfast tray, slow to response HEENT: R eye with corneal opacity, L eye PERRL, EOMI, no scleral icterus  Cardiac: RRR, no rubs, murmurs or gallops  Pulm: clear to auscultation bilaterally, no wheezes, rales, or rhonchi  Abd: soft, nontender, nondistended, BS present  Ext: warm and well perfused Neuro: alert and awake, oriented to name, place, and year but slow to answer questions this AM  Lab Results: Basic Metabolic Panel:  Recent Labs Lab 12/06/13 1634 12/07/13 0638  NA 131* 133*  K 3.5* 3.2*  CL 95* 94*  CO2 22 25  GLUCOSE 122* 102*  BUN 13 8  CREATININE 0.60 0.51  CALCIUM 9.1 9.5   CBC:  Recent Labs Lab 12/04/13 1512  12/06/13 0700 12/07/13 0638  WBC 11.1*  < > 9.2 9.4  NEUTROABS 9.0*  --   --   --   HGB 14.0  < > 12.9 13.8  HCT 39.7  < > 35.9* 38.6  MCV 90.8  < > 90.0 90.6  PLT 204  < > 195 238  < > = values in this  interval not displayed.  Cardiac Enzymes:  Recent Labs Lab 12/04/13 2010 12/05/13 0508 12/05/13 1153  CKTOTAL 142  --   --   TROPONINI <0.30 <0.30 <0.30   Urinalysis:  Recent Labs Lab 12/04/13 1525 12/07/13 0040  COLORURINE YELLOW YELLOW  LABSPEC 1.018 1.004*  PHURINE 6.5 7.0  GLUCOSEU NEGATIVE NEGATIVE  HGBUR MODERATE* MODERATE*  BILIRUBINUR NEGATIVE NEGATIVE  KETONESUR 15* NEGATIVE  PROTEINUR NEGATIVE NEGATIVE  UROBILINOGEN 1.0 1.0  NITRITE NEGATIVE NEGATIVE  LEUKOCYTESUR NEGATIVE NEGATIVE   Studies/Results: Mr Brain Wo Contrast  12/05/2013   CLINICAL DATA:  Altered mental status  EXAM: MRI HEAD WITHOUT CONTRAST  TECHNIQUE: Multiplanar, multiecho pulse sequences of the brain and surrounding structures were obtained without intravenous contrast.  COMPARISON:  CT head 12/04/2013  FINDINGS: Mild atrophy. Mild ventricular enlargement consistent with atrophy. Pituitary normal in size. Craniocervical junction normal.  Negative for acute infarct. Mild to moderate chronic microvascular ischemic changes in the white matter and pons. Small chronic infarct right lateral cerebellum.  Negative for hemorrhage or mass lesion.  Paranasal sinuses are clear.  Fusion hardware in the upper cervical spine.  IMPRESSION: Atrophy and chronic microvascular ischemia.  No acute abnormality.   Electronically Signed   By: Marlan Palauharles  Clark M.D.   On: 12/05/2013 17:07   Medications: I have reviewed the patient's current medications. Scheduled Meds: . ALPRAZolam  0.5 mg Oral TID  . amLODipine  10 mg Oral Daily  . azithromycin  500 mg Intravenous Q24H  . cefTRIAXone (ROCEPHIN)  IV  1 g Intravenous Q24H  . Chlorhexidine Gluconate Cloth  6 each Topical Q0600  . divalproex  500 mg Oral Daily  . hydroxychloroquine  400 mg Oral Daily  . levothyroxine  25 mcg Oral QAC breakfast  . metoprolol  50 mg Oral Daily  . mirtazapine  15 mg Oral QHS  . mupirocin ointment  1 application Nasal BID  . pantoprazole  40  mg Oral Daily  . potassium chloride  10 mEq Intravenous Q1 Hr x 4  . sertraline  50 mg Oral Daily   Continuous Infusions:   PRN Meds:.acetaminophen **OR** acetaminophen, diphenoxylate-atropine, hydrALAZINE, LORazepam, oxyCODONE Assessment/Plan: Active Problems:   Altered mental status   Hypothyroidism   Hyponatremia   Acute encephalopathy  Amber Pierce is a 73 year old female with seizure disorder, OCD, hypothyroidism hospitalized for acute encephalopathy likely 2/2 CAP now with improvement.  Acute encephalopathy: Likely 2/2 lingular PNA seen on CXR 10/31. Still appears confused and disoriented at bedside which may be related to electrolyte abnormalities: Na, K low. -Continue with azithromycin & ctx (Day 2) -Replete K 40mEq IV & recheck BMET -Hold IV fluids for now as she is tolerating PO intake -Continue reduced dose Xanax 0.5mg  TID  Hematuria: Stable from prior UA though less RBCs seen on repeat.  -Consider workup as outpatient  Hypothyroidism: TSH normal. Continue home Synthroid.  OCD: Continue home psych meds.  Seizure disorder: Continue home Depakote and Ativan prn for seizures.  HTN: Mostly 130-140/50-70 with a few values above. Continue home meds.  #FEN:  -Diet: Dysphagia 3 diet (SLP following, appreciate recs)  Dispo: Disposition is deferred at this time, awaiting improvement of current medical problems.  Anticipated discharge in approximately 1-2 day(s).  -Consulted social work for SNF placement  The patient does have a current PCP Aggie Cosier(Balaji Desai, MD) and [facility] after discharge.  The patient does have transportation limitations that hinder transportation to clinic appointments.  .Services Needed at time of discharge: Y = Yes, Blank = No PT:   OT:   RN:   Equipment:   Other:     LOS: 3 days   Heywood Ilesushil Patel, MD 12/07/2013, 12:13 PM

## 2013-12-07 NOTE — Progress Notes (Signed)
Subjective: Patient is currently calm, following commands (at times requires me to ask question multiple time) has no complaints.   Objective: Current vital signs: BP 163/61 mmHg  Pulse 85  Temp(Src) 98.2 F (36.8 C) (Oral)  Resp 20  Wt 48.898 kg (107 lb 12.8 oz)  SpO2 99% Vital signs in last 24 hours: Temp:  [97.6 F (36.4 C)-98.5 F (36.9 C)] 98.2 F (36.8 C) (11/02 1028) Pulse Rate:  [52-85] 85 (11/02 1028) Resp:  [16-20] 20 (11/02 1028) BP: (131-163)/(50-70) 163/61 mmHg (11/02 1028) SpO2:  [98 %-100 %] 99 % (11/02 1028) Weight:  [48.898 kg (107 lb 12.8 oz)] 48.898 kg (107 lb 12.8 oz) (11/02 0258)  Intake/Output from previous day: 11/01 0701 - 11/02 0700 In: 240 [P.O.:240] Out: -  Intake/Output this shift: Total I/O In: 120 [P.O.:120] Out: -  Nutritional status: DIET DYS 3  Neurologic Exam: General: NAD Mental Status: Alert, oriented to hospital, year but believes it is October. Speech fluent without evidence of aphasia.  Able to follow  Simple step commands without difficulty. Cranial Nerves: II: Visual fields grossly normal, left pupil 2mm, round, reactive to light --right eye has significant scaring III,IV, VI: ptosis not present, extra-ocular motions intact bilaterally V,VII: smile symmetric, facial light touch sensation normal bilaterally VIII: hearing normal bilaterally IX,X: gag reflex present XI: bilateral shoulder shrug XII: midline tongue extension without atrophy or fasciculations --She holds her neck stiff but denies any meningismus. She is able to look to the left and right slightly but states she has minimal neck ROM since" her previous surgery."   Motor: Right : Upper extremity   5/5    Left:     Upper extremity   5/5  Lower extremity   4/5     Lower extremity   4/5 Tone and bulk:normal tone throughout; no atrophy noted Sensory: Pinprick and light touch intact throughout, bilaterally Deep Tendon Reflexes:  Right: Upper Extremity   Left: Upper  extremity   biceps (C-5 to C-6) 2/4   biceps (C-5 to C-6) 2/4 tricep (C7) 2/4    triceps (C7) 2/4 Brachioradialis (C6) 2/4  Brachioradialis (C6) 2/4  Lower Extremity Lower Extremity  quadriceps (L-2 to L-4) 2/4   quadriceps (L-2 to L-4) 2/4 Achilles (S1) 21/4   Achilles (S1) 1/4  Plantars: Right: downgoing   Left: downgoing   Lab Results: Basic Metabolic Panel:  Recent Labs Lab 12/04/13 1512 12/05/13 0508 12/06/13 0700 12/06/13 1634 12/07/13 0638  NA 128* 134* 129* 131* 133*  K 3.7 3.9 2.7* 3.5* 3.2*  CL 87* 93* 91* 95* 94*  CO2 25 23 25 22 25   GLUCOSE 99 104* 97 122* 102*  BUN 16 11 17 13 8   CREATININE 0.59 0.64 0.71 0.60 0.51  CALCIUM 10.4 9.6 9.0 9.1 9.5    Liver Function Tests:  Recent Labs Lab 12/04/13 1512  AST 45*  ALT 29  ALKPHOS 80  BILITOT 0.5  PROT 8.4*  ALBUMIN 4.4   No results for input(s): LIPASE, AMYLASE in the last 168 hours.  Recent Labs Lab 12/04/13 2010  AMMONIA 20    CBC:  Recent Labs Lab 12/04/13 1512 12/05/13 0508 12/06/13 0700 12/07/13 0638  WBC 11.1* 12.0* 9.2 9.4  NEUTROABS 9.0*  --   --   --   HGB 14.0 12.6 12.9 13.8  HCT 39.7 36.4 35.9* 38.6  MCV 90.8 92.6 90.0 90.6  PLT 204 198 195 238    Cardiac Enzymes:  Recent Labs Lab 12/04/13 2010 12/05/13  [id number] 12/05/13 1153  CKTOTAL 142  --   --   TROPONINI <0.30 <0.30 <0.30    Lipid Panel: No results for input(s): CHOL, TRIG, HDL, CHOLHDL, VLDL, LDLCALC in the last 168 hours.  CBG:  Recent Labs Lab 12/04/13 1420  GLUCAP 119*    Microbiology: Results for orders placed or performed during the hospital encounter of 12/04/13  Culture, blood (routine x 2)     Status: None (Preliminary result)   Collection Time: 12/04/13  8:00 PM  Result Value Ref Range Status   Specimen Description BLOOD RIGHT ARM  Final   Special Requests BOTTLES DRAWN AEROBIC AND ANAEROBIC 8CC  Final   Culture  Setup Time   Final    12/05/2013 01:20 Performed at [facility]     Culture   Final           BLOOD CULTURE RECEIVED NO GROWTH TO DATE CULTURE WILL BE HELD FOR 5 DAYS BEFORE ISSUING A FINAL NEGATIVE REPORT Performed at [facility]    Report Status PENDING  Incomplete  Culture, blood (routine x 2)     Status: None (Preliminary result)   Collection Time: 12/04/13  8:10 PM  Result Value Ref Range Status   Specimen Description BLOOD RIGHT HAND  Final   Special Requests BOTTLES DRAWN AEROBIC ONLY 8CC  Final   Culture  Setup Time   Final    12/05/2013 01:21 Performed at [facility]    Culture   Final           BLOOD CULTURE RECEIVED NO GROWTH TO DATE CULTURE WILL BE HELD FOR 5 DAYS BEFORE ISSUING A FINAL NEGATIVE REPORT Performed at [facility]    Report Status PENDING  Incomplete  MRSA PCR Screening     Status: Abnormal   Collection Time: 12/07/13 12:40 AM  Result Value Ref Range Status   MRSA by PCR POSITIVE (A) NEGATIVE Final    Comment:        The GeneXpert MRSA Assay (FDA approved for NASAL specimens only), is one component of a comprehensive MRSA colonization surveillance program. It is not intended to diagnose MRSA infection nor to guide or monitor treatment for MRSA infections. RESULT CALLED TO, READ BACK BY AND VERIFIED WITH: CALLED TO RN PHIL ASIDI [phone] @0255  THANEY     Coagulation Studies: No results for input(s): LABPROT, INR in the last 72 hours.  Imaging: Mr Brain Wo Contrast  12/05/2013   CLINICAL DATA:  Altered mental status  EXAM: MRI HEAD WITHOUT CONTRAST  TECHNIQUE: Multiplanar, multiecho pulse sequences of the brain and surrounding structures were obtained without intravenous contrast.  COMPARISON:  CT head 12/04/2013  FINDINGS: Mild atrophy. Mild ventricular enlargement consistent with atrophy. Pituitary normal in size. Craniocervical junction normal.  Negative for acute infarct. Mild to moderate chronic microvascular ischemic changes in the white matter and pons. Small chronic infarct right  lateral cerebellum.  Negative for hemorrhage or mass lesion.  Paranasal sinuses are clear.  Fusion hardware in the upper cervical spine.  IMPRESSION: Atrophy and chronic microvascular ischemia.  No acute abnormality.   Electronically Signed   By: Marlan Palauharles  Clark M.D.   On: 12/05/2013 17:07   EEG: IMPRESSION: This is an abnormal electroencephalogram secondary to intermittent slowing and discharges of triphasic morphology. These findings are consistent with an encephalopathy, etiology nonspecific  Medications:  Scheduled: . ALPRAZolam  0.5 mg Oral TID  . amLODipine  10 mg Oral Daily  . azithromycin  500 mg  Intravenous Q24H  . cefTRIAXone (ROCEPHIN)  IV  1 g Intravenous Q24H  . Chlorhexidine Gluconate Cloth  6 each Topical Q0600  . divalproex  500 mg Oral Daily  . hydroxychloroquine  400 mg Oral Daily  . Influenza vac split quadrivalent PF  0.5 mL Intramuscular Tomorrow-1000  . levothyroxine  25 mcg Oral QAC breakfast  . metoprolol  50 mg Oral Daily  . mirtazapine  15 mg Oral QHS  . mupirocin ointment  1 application Nasal BID  . pantoprazole  40 mg Oral Daily  . pneumococcal 23 valent vaccine  0.5 mL Intramuscular Tomorrow-1000  . potassium chloride  10 mEq Intravenous Q1 Hr x 4  . sertraline  50 mg Oral Daily    Assessment/Plan:  73y/o woman with known seizure disorder presenting for breakthrough seizure with  AMS. AMS is improving daily. Leukocytosis has resolved and patient is afebrile. EEG shows lowing with triphasic waves. No further seizures while in hospital. Currently being treated with Azythromycin and Rocephin for PNA. With continued improvement would hold of on LP and continue to treat underlying infection.   Recommend: 1) Continue Depakote 500 mg daily 2) treat underlying PNA 3) As patient is awake, Afebrile and leucocytosis has resolved, would not recommend LP at this time.   Neurology S/O    Felicie Morn PA-C [facility] [phone]  12/07/2013, 11:19  AM

## 2013-12-07 NOTE — Plan of Care (Signed)
Problem: Phase I Progression Outcomes Goal: Seizure activity controlled Outcome: Progressing Goal: IV access obtained Outcome: Completed/Met Date Met:  12/07/13 Goal: Maintaining airway and VS stable Outcome: Progressing Goal: Voiding-avoid urinary catheter unless indicated Outcome: Progressing

## 2013-12-08 MED ORDER — SENNOSIDES-DOCUSATE SODIUM 8.6-50 MG PO TABS
2.0000 | ORAL_TABLET | Freq: Two times a day (BID) | ORAL | Status: DC
  Administered 2013-12-08 – 2013-12-10 (×3): 2 via ORAL
  Filled 2013-12-08 (×3): qty 2

## 2013-12-08 MED ORDER — AZITHROMYCIN 500 MG PO TABS
500.0000 mg | ORAL_TABLET | Freq: Every day | ORAL | Status: DC
  Administered 2013-12-09 – 2013-12-10 (×2): 500 mg via ORAL
  Filled 2013-12-08 (×2): qty 1

## 2013-12-08 MED ORDER — AZITHROMYCIN 500 MG PO TABS: 500.0000 mg | ORAL_TABLET | Freq: Every day | ORAL | Status: DC

## 2013-12-08 MED ORDER — AMOXICILLIN-POT CLAVULANATE 875-125 MG PO TABS
1.0000 | ORAL_TABLET | Freq: Two times a day (BID) | ORAL | Status: DC
  Administered 2013-12-09 – 2013-12-10 (×3): 1 via ORAL
  Filled 2013-12-08 (×3): qty 1

## 2013-12-08 MED ORDER — AMOXICILLIN-POT CLAVULANATE 875-125 MG PO TABS: 1.0000 | ORAL_TABLET | Freq: Two times a day (BID) | ORAL | Status: DC

## 2013-12-08 NOTE — Progress Notes (Addendum)
Patient ID: Amber Pierce, female   DOB: 11/28/1940, 73 y.o.   MRN: [id number] Medicine attending:  I saw this patient today together with resident physician Dr.Rushil Allena KatzPatel. She continues on antibiotics for pneumonia. Mental status has returned to her baseline. Plans in progress for transfer to an appropriate extended care facility [place] to [place].

## 2013-12-08 NOTE — Progress Notes (Signed)
Subjective: Repeat K after supplementation was 3.8 yesterday. This AM, she has no complaints and continues to appear in better spirits.   Objective: Vital signs in last 24 hours: Filed Vitals:   12/07/13 2308 12/08/13 0202 12/08/13 0500 12/08/13 0608  BP: 159/58 170/75  149/74  Pulse: 60 77  85  Temp: 97.8 F (36.6 C) 97.7 F (36.5 C)  98.1 F (36.7 C)  TempSrc: Axillary Oral  Axillary  Resp: 16 16  16   Weight:   106 lb 9.6 oz (48.353 kg)   SpO2: 96% 100%  98%   Weight change: -1 lb 3.2 oz (-0.544 kg)  Intake/Output Summary (Last 24 hours) at 12/08/13 0711 Last data filed at 12/07/13 1737  Gross per 24 hour  Intake    480 ml  Output      0 ml  Net    480 ml   Physical Exam  General: alert and awake, sitting up in bed with breakfast tray, slow to response HEENT: R eye with corneal opacity, L eye PERRL, EOMI, no scleral icterus  Cardiac: RRR, no rubs, murmurs or gallops  Pulm: clear to auscultation bilaterally, no wheezes, rales, or rhonchi  Abd: soft, nontender, nondistended, BS present  Ext: warm and well perfused, mild pain in R > L leg Neuro: alert and awake, oriented to name, place, and year but slow to answer questions this AM  Lab Results: Basic Metabolic Panel:  Recent Labs Lab 12/07/13 0638 12/07/13 1320 12/07/13 2109  NA 133*  --  134*  K 3.2*  --  3.8  CL 94*  --  95*  CO2 25  --  25  GLUCOSE 102*  --  112*  BUN 8  --  11  CREATININE 0.51  --  0.64  CALCIUM 9.5  --  9.3  MG  --  1.8  --    CBC:  Recent Labs Lab 12/04/13 1512  12/06/13 0700 12/07/13 0638  WBC 11.1*  < > 9.2 9.4  NEUTROABS 9.0*  --   --   --   HGB 14.0  < > 12.9 13.8  HCT 39.7  < > 35.9* 38.6  MCV 90.8  < > 90.0 90.6  PLT 204  < > 195 238  < > = values in this interval not displayed.  Cardiac Enzymes:  Recent Labs Lab 12/04/13 2010 12/05/13 0508 12/05/13 1153  CKTOTAL 142  --   --   TROPONINI <0.30 <0.30 <0.30   Urinalysis:  Recent Labs Lab 12/04/13 1525  12/07/13 0040  COLORURINE YELLOW YELLOW  LABSPEC 1.018 1.004*  PHURINE 6.5 7.0  GLUCOSEU NEGATIVE NEGATIVE  HGBUR MODERATE* MODERATE*  BILIRUBINUR NEGATIVE NEGATIVE  KETONESUR 15* NEGATIVE  PROTEINUR NEGATIVE NEGATIVE  UROBILINOGEN 1.0 1.0  NITRITE NEGATIVE NEGATIVE  LEUKOCYTESUR NEGATIVE NEGATIVE   Studies/Results: No results found. Medications: I have reviewed the patient's current medications. Scheduled Meds: . ALPRAZolam  0.5 mg Oral TID  . amLODipine  10 mg Oral Daily  . azithromycin  500 mg Intravenous Q24H  . cefTRIAXone (ROCEPHIN)  IV  1 g Intravenous Q24H  . Chlorhexidine Gluconate Cloth  6 each Topical Q0600  . divalproex  500 mg Oral Daily  . hydroxychloroquine  400 mg Oral Daily  . levothyroxine  25 mcg Oral QAC breakfast  . metoprolol  50 mg Oral Daily  . mirtazapine  15 mg Oral QHS  . mupirocin ointment  1 application Nasal BID  . nicotine  7 mg Transdermal Daily  .  pantoprazole  40 mg Oral Daily  . sertraline  50 mg Oral Daily   Continuous Infusions:   PRN Meds:.acetaminophen **OR** acetaminophen, diphenoxylate-atropine, hydrALAZINE, LORazepam, oxyCODONE Assessment/Plan: Active Problems:   Altered mental status   Hypothyroidism   Hyponatremia   Acute encephalopathy   CAP (community acquired pneumonia)   Seizure disorder  Ms. Youlanda RoysYoung Evans is a 73 year old female with seizure disorder, OCD, hypothyroidism hospitalized for acute encephalopathy likely 2/2 CAP now with improvement.  Acute encephalopathy: Likely 2/2 lingular PNA seen on CXR 10/31. Hypokalemia resolved though still hyponatremic at 134.  -Transition to azithromycin & Augmentin (Day 3/7) -Continue reduced dose Xanax 0.5mg  TID  Hematuria: Stable from prior UA though less RBCs seen on repeat.  -Consider workup as outpatient  Hypothyroidism: TSH normal. Continue home Synthroid.  OCD: Continue home psych meds.  Seizure disorder: Continue home Depakote and Ativan prn for seizures.  HTN:  Mostly 140-160/50-70. Continue home meds.  #FEN:  -Diet: Dysphagia 3 diet (SLP following, appreciate recs)  Dispo: Disposition is deferred at this time, awaiting improvement of current medical problems.  Anticipated discharge in approximately 1-2 day(s).  -Consulted social work for SNF placement  The patient does have a current PCP Aggie Cosier(Balaji Desai, MD) and [facility] after discharge.  The patient does have transportation limitations that hinder transportation to clinic appointments.  .Services Needed at time of discharge: Y = Yes, Blank = No PT:   OT:   RN:   Equipment:   Other:     LOS: 4 days   Heywood Ilesushil Patel, MD 12/08/2013, 7:11 AM

## 2013-12-08 NOTE — Progress Notes (Signed)
Physical Therapy Treatment Patient Details Name: Amber PalauLynda Young Pierce MRN: [id number] DOB: 09/16/1940 Today's Date: 12/08/2013    History of Present Illness Patient is a 73 y/o female admitted with AMS. From MD note, per sister, pt was found this morning leaning to one side and was incoherent for 10 minutes, consistent with prior post-ictal states. She has several similar episodes over the past few days and went [facility] though this one was more severe. She has a history of seizures, and she thinks this recent episode is related to her pain medication. Pt was hospitalized back in February 2014 and April 2013 for AMS, each time with concomitant electrolyte abnormalities and seizures. Chest XRAY- Concern for lingular pneumonia. MRI and CT head - unremarkable.     PT Comments    Pt continues to demonstrate extreme forward flexed posture with decreased knee and hip extension.  Tolerated OOB and gait x 20' with +2 assist and chair follow.  Continue to feel she will need SNF level of care at time of D/c.   Follow Up Recommendations  SNF;Supervision/Assistance - 24 hour     Equipment Recommendations  Other (comment)    Recommendations for Other Services OT consult     Precautions / Restrictions Precautions Precautions: Fall Precaution Comments: Seizure precautions, extremely forward flexed posture, decreased initiation during PT session Restrictions Weight Bearing Restrictions: No    Mobility  Bed Mobility Overal bed mobility: Needs Assistance Bed Mobility: Supine to Sit     Supine to sit: Max assist;HOB elevated     General bed mobility comments: Pt requires max A verbal and tactile cues to initiate moving LEs out of bed.  Also requires hand over hand assist for reaching for rail to sit up.  Also requires max A to elevate trunk into sitting position.   Transfers Overall transfer level: Needs assistance Equipment used: 1 person hand held assist;2 person hand held  assist Transfers: Sit to/from [employer];Stand Pivot Transfers Sit to Stand: Mod assist Stand pivot transfers: Max assist       General transfer comment: Pt performed stand pivot transfer to/from bedside commode during session.  Requires max A for hip extension with pt unable to reach full standing.  Continues to demonstrate forward trunk lean and gaze, but could correct with mod/max A during session.   Ambulation/Gait Ambulation/Gait assistance: +2 physical assistance;+2 safety/equipment Ambulation Distance (Feet): 20 Feet Assistive device: 2 person hand held assist (PT under L axilla, R HHA from tech) Gait Pattern/deviations: Step-to pattern;Shuffle;Trunk flexed;Narrow base of support;Decreased stride length Gait velocity: slow   General Gait Details: Pt very lethargic and with decreased initaiton during session, therefore assisted pt under L axilla and nurse tech assisting with R HHA.  she did tolerate up to 20' gait, however requires max faciliation for upright posture.  She is able to extend knees and extend hips individually, however cannot combine movements for upright posture.     Stairs            Wheelchair Mobility    Modified Rankin (Stroke Patients Only)       Balance Overall balance assessment: Needs assistance Sitting-balance support: Feet supported;Single extremity supported Sitting balance-Leahy Scale: Poor Sitting balance - Comments: Pt continues to have forward LOB and requires min A to regain balance.  Postural control: Right lateral lean (and forward lean) Standing balance support: Bilateral upper extremity supported Standing balance-Leahy Scale: Poor Standing balance comment: Requires max A (+2 for safety) with standing and gait.  Cognition Arousal/Alertness: Lethargic Behavior During Therapy: Flat affect Overall Cognitive Status: No family/caregiver present to determine baseline cognitive functioning       Memory:  Decreased short-term memory (Pt with very decreased initiation during all mobility)              Exercises      General Comments        Pertinent Vitals/Pain Pain Assessment: Faces Faces Pain Scale: Hurts little more Pain Location: Pt states "all over" Pain Descriptors / Indicators: Discomfort Pain Intervention(s): Repositioned    Home Living                      Prior Function            PT Goals (current goals can now be found in the care plan section) Acute Rehab PT Goals PT Goal Formulation: With patient Time For Goal Achievement: 12/21/13 Potential to Achieve Goals: Fair Progress towards PT goals: Progressing toward goals    Frequency  Min 3X/week    PT Plan Current plan remains appropriate    Co-evaluation             End of Session   Activity Tolerance: Patient limited by fatigue Patient left: in chair;with call bell/phone within reach;with chair alarm set     Time: 1144-1210 PT Time Calculation (min): 26 min  Charges:  $Gait Training: 8-22 mins $Therapeutic Activity: 8-22 mins                    G Codes:      Amber Pierce, Amber Pierce 12/08/2013, 12:22 PM

## 2013-12-09 LAB — BASIC METABOLIC PANEL
ANION GAP: 14 (ref 5–15)
BUN: 17 mg/dL (ref 6–23)
CALCIUM: 9.1 mg/dL (ref 8.4–10.5)
CHLORIDE: 95 meq/L — AB (ref 96–112)
CO2: 22 meq/L (ref 19–32)
Creatinine, Ser: 0.73 mg/dL (ref 0.50–1.10)
GFR calc Af Amer: >90 mL/min (ref >90)
GFR calc non Af Amer: 83 mL/min — ABNORMAL LOW (ref >90)
Glucose, Bld: 99 mg/dL (ref 70–99)
Potassium: 4.5 mEq/L (ref 3.7–5.3)
Sodium: 131 mEq/L — ABNORMAL LOW (ref 137–147)

## 2013-12-09 MED ORDER — SODIUM CHLORIDE 0.9 % IV SOLN
INTRAVENOUS | Status: DC
  Administered 2013-12-09: 09:00:00 via INTRAVENOUS

## 2013-12-09 MED ORDER — ALPRAZOLAM 0.5 MG PO TABS: 0.5000 mg | ORAL_TABLET | Freq: Three times a day (TID) | ORAL | Status: DC

## 2013-12-09 MED ORDER — OXYCODONE HCL 5 MG PO TABS: 5.0000 mg | ORAL_TABLET | Freq: Two times a day (BID) | ORAL | Status: DC | PRN

## 2013-12-09 MED ORDER — AZITHROMYCIN 500 MG PO TABS: 500.0000 mg | ORAL_TABLET | Freq: Every day | ORAL | Status: DC

## 2013-12-09 MED ORDER — OXYCODONE HCL 5 MG PO TABS: 5.0000 mg | ORAL_TABLET | Freq: Three times a day (TID) | ORAL | Status: DC | PRN

## 2013-12-09 MED ORDER — SENNOSIDES-DOCUSATE SODIUM 8.6-50 MG PO TABS: 2.0000 | ORAL_TABLET | Freq: Two times a day (BID) | ORAL | Status: AC

## 2013-12-09 MED ORDER — AMOXICILLIN-POT CLAVULANATE 875-125 MG PO TABS: 1.0000 | ORAL_TABLET | Freq: Two times a day (BID) | ORAL | Status: DC

## 2013-12-09 NOTE — Progress Notes (Addendum)
Clinical Social Worker spoke with admissions coordinator, Zella [facility]Robin at [facility] [place] who reported she was unable to verify pt's payor source. CSW contacted a facility in [place] that was able to verify insurance and facility as well as this CSW faxed [facility] cards[facility]. [facility] later reported bookkeeping wanted to compare insurance verifications.   CSW placed another call to the admissions coordinator [facility] who reported she was able to verify insurance but no longer had a bed available for today. [facility] stated pt could be admitted tomorrow(12/10/2013). CSW also submitted [facility] for pt which is currently under review. 30 day note was signed by MD for possible [facility]. CSW awaiting [facility] approval/number.  CSW spoke with pt's sister, Drenda FreezeFran and updated her on progress. Pt's sister stated again she did not want pt placed under the care of MD with pending lawsuit.   [facility] [place] did not have pending beds.   CSW continues to follow pt to provide support and facilitate [facility].   Derenda FennelBashira Carynn Felling, MSW, LCSWA [phone] 12/09/2013 5:08 PM

## 2013-12-09 NOTE — Progress Notes (Signed)
Patient ID: Amber Pierce, female   DOB: September 11, 1940, 73 y.o.   MRN: [id number] Medicine attending: I examined this woman today together with resident physician Heywood Ilesushil Patel and I concur with his evaluation and management plan. She is alert and cooperative. She has poor short-term memory. I suspect that she has some baseline underlying mild dementia. She is aware enough to ask for her scheduled meds and no that they are overdue. Plans in progress to find a suitable extended-care facility near her home in [place].

## 2013-12-09 NOTE — Progress Notes (Signed)
Speech Language Pathology Treatment: Dysphagia  Patient Details Name: Amber PalauLynda Young Evans MRN: [id number] DOB: [birth year] Today's Date: 12/09/2013 Time: [id number] SLP Time Calculation (min): 15 min  Assessment / Plan / Recommendation Clinical Impression  Pt consumed thin liquids and regular textures with Mod cues from SLP for small bites/sips and slower intake. Pt without overt signs of aspiration today at bedside, however given continued confusion and increased impulsivity with straws, would recommend to continue with Dys 3 textures and thin liquids by cup sips. SLP to continue to follow for tolerance and upgrade.   HPI HPI: 73 year old female with h/o partial seizure, GERD, anxiety, OCD, hypothyroidism admitted with altered mental status. MRI Atrophy and chronic microvascular ischemia. No acute abnormality.  CXR Concern for lingular pneumonia.   Pertinent Vitals Pain Assessment: No/denies pain  SLP Plan  Continue with current plan of care    Recommendations Diet recommendations: Dysphagia 3 (mechanical soft);Thin liquid Liquids provided via: Cup;Straw Medication Administration: Whole meds with puree Supervision: Patient able to self feed;Full supervision/cueing for compensatory strategies Compensations: Slow rate;Small sips/bites Postural Changes and/or Swallow Maneuvers: Seated upright 90 degrees              Oral Care Recommendations: Oral care BID Follow up Recommendations: Skilled Nursing facility Plan: Continue with current plan of care    GO      Maxcine HamLaura Paiewonsky, M.A. CCC-SLP [phone]  Maxcine Hamaiewonsky, Tommy Goostree 12/09/2013, 3:06 PM

## 2013-12-09 NOTE — Progress Notes (Signed)
Per Social Work, there was a possibility that this patient may have gone to SNF. Med rec was done, but there was a discrepancy in the dose of oxyIR 5mg .  As such, I am voiding the prescription written by Dr. Mariea ClontsEmokpae and have signed for a new prescription.

## 2013-12-09 NOTE — Progress Notes (Addendum)
Clinical Social Worker met with pt's sister, Fran Fleming at bedside. Pt's sister expressed she did not want pt placed at a SNF in [place] due to having a lawsuit against MD's working at certain SNF's in that area. Pt's sister also reported possibly placing pt in [place] close to pt's son. Facilities in [place] have not offered availability. Pt's sister stated she has not spoken to pt's son in several years. CSW has submitted clinicals for [place] and [place]. CSW has spoken to two admissions coordinators (1. [facility] and 2. [facility]) and awaiting approval from either facility. CSW actively locating SNF placement for pt in [place].  Patient's sister, Fran Fleming (updated contact information: [phone])   , MSW, LCSWA [phone] 12/09/2013 2:39 PM   

## 2013-12-09 NOTE — Clinical Social Work Note (Signed)
Clinical Social Worker attempted to contact pt's sister, Drenda FreezeFran and pt's son in reference to post-acute placement. CSW unable to contact pt's sister and arrange SNF placement. Pt currently does not have a SNF disposition plan. CSW will continue to contact pt's sister and son.  Amber FennelBashira Amber Pierce, MSW, LCSWA [phone] 12/09/2013 9:49 AM

## 2013-12-09 NOTE — Progress Notes (Addendum)
Subjective: This AM, she has no complaints and continues to appear in better spirits though does not recall seeing her sister at any point during hospitalization. She was questioning us about why she hadn't gotten her Xanax and Depakote, and we explained that it's scheduled for later in the AM. She acknowledged   Objective: Vital signs in last 24 hours: Filed Vitals:   12/09/13 0124 12/09/13 0534 12/09/13 1029 12/09/13 1212  BP: 126/51 123/52 131/68 147/66  Pulse: 81 73 93 84  Temp: 98.1 F (36.7 C) 98.6 F (37 C) 98.9 F (37.2 C) 98.2 F (36.8 C)  TempSrc: Oral Oral Oral Oral  Resp: 20 20 18 18   Weight:      SpO2: 96% 96% 96% 96%   Weight change:   Intake/Output Summary (Last 24 hours) at 12/09/13 1441 Last data filed at 12/09/13 1030  Gross per 24 hour  Intake    480 ml  Output      0 ml  Net    480 ml   Physical Exam  General: alert and awake, sitting up in bed with breakfast tray HEENT: R eye with corneal opacity, L PERRL, EOMI, no scleral icterus  Cardiac: RRR, no rubs, murmurs or gallops  Pulm: clear to auscultation bilaterally, no wheezes, rales, or rhonchi  Abd: soft, nontender, nondistended, BS present  Ext: warm and well perfused, R > L leg though stable Neuro: oriented to name and date but not place [facility] and year (2095), moves all extremities freely  Lab Results: Basic Metabolic Panel:  Recent Labs Lab 12/07/13 1320 12/07/13 2109 12/09/13 0520  NA  --  134* 131*  K  --  3.8 4.5  CL  --  95* 95*  CO2  --  25 22  GLUCOSE  --  112* 99  BUN  --  11 17  CREATININE  --  0.64 0.73  CALCIUM  --  9.3 9.1  MG 1.8  --   --    CBC:  Recent Labs Lab 12/04/13 1512  12/06/13 0700 12/07/13 0638  WBC 11.1*  < > 9.2 9.4  NEUTROABS 9.0*  --   --   --   HGB 14.0  < > 12.9 13.8  HCT 39.7  < > 35.9* 38.6  MCV 90.8  < > 90.0 90.6  PLT 204  < > 195 238  < > = values in this interval not displayed.  Cardiac Enzymes:  Recent  Labs Lab 12/04/13 2010 12/05/13 0508 12/05/13 1153  CKTOTAL 142  --   --   TROPONINI <0.30 <0.30 <0.30   Urinalysis:  Recent Labs Lab 12/04/13 1525 12/07/13 0040  COLORURINE YELLOW YELLOW  LABSPEC 1.018 1.004*  PHURINE 6.5 7.0  GLUCOSEU NEGATIVE NEGATIVE  HGBUR MODERATE* MODERATE*  BILIRUBINUR NEGATIVE NEGATIVE  KETONESUR 15* NEGATIVE  PROTEINUR NEGATIVE NEGATIVE  UROBILINOGEN 1.0 1.0  NITRITE NEGATIVE NEGATIVE  LEUKOCYTESUR NEGATIVE NEGATIVE   Studies/Results: No results found. Medications: I have reviewed the patient's current medications. Scheduled Meds: . ALPRAZolam  0.5 mg Oral TID  . amLODipine  10 mg Oral Daily  . amoxicillin-clavulanate  1 tablet Oral Q12H  . azithromycin  500 mg Oral Daily  . Chlorhexidine Gluconate Cloth  6 each Topical Q0600  . divalproex  500 mg Oral Daily  . hydroxychloroquine  400 mg Oral Daily  . levothyroxine  25 mcg Oral QAC breakfast  . metoprolol  50 mg Oral Daily  . mirtazapine  15 mg Oral QHS  .  mupirocin ointment  1 application Nasal BID  . nicotine  7 mg Transdermal Daily  . pantoprazole  40 mg Oral Daily  . senna-docusate  2 tablet Oral BID  . sertraline  50 mg Oral Daily   Continuous Infusions: . sodium chloride 75 mL/hr at 12/09/13 0840   PRN Meds:.acetaminophen **OR** acetaminophen, diphenoxylate-atropine, hydrALAZINE, LORazepam, oxyCODONE Assessment/Plan: Active Problems:   Altered mental status   Hypothyroidism   Hyponatremia   Acute encephalopathy   CAP (community acquired pneumonia)   Seizure disorder  [name] is a 73 year old female with seizure disorder, OCD, hypothyroidism hospitalized for acute encephalopathy likely 2/2 CAP awaiting SNF placement.  Acute encephalopathy: Likely 2/2 lingular PNA seen on CXR 10/31. Na 131 this AM. -Start NS @ 75cc/hr with repeat BMET -Continue azithromycin & Augmentin (Day 4/7) -Continue reduced dose Xanax 0.5mg  TID  Hematuria: Stable from prior UA though less  RBCs seen on repeat.  -Consider workup as outpatient  Hypothyroidism: TSH normal. Continue home Synthroid.  OCD: Continue home psych meds.  Seizure disorder: Continue home Depakote and Ativan prn for seizures.  HTN: Mostly 140-160/50-70. Continue home meds.  #FEN:  -Diet: Dysphagia 3 diet (SLP following, appreciate recs) -NS @ 75cc/hr  Dispo: Awaiting SNF placement. -Per Social Work, difficulty getting in touch with family but will continue trying -Spoke to RN to collect accurate contact information and document in system should any family come up to visit  The patient does have a current PCP Aggie Cosier(Balaji Desai, MD) and does not [facility]up appointment after discharge.  The patient does have transportation limitations that hinder transportation to clinic appointments.  .Services Needed at time of discharge: Y = Yes, Blank = No PT:   OT:   RN:   Equipment:   Other:     LOS: 5 days   Heywood Ilesushil Patel, MD 12/09/2013, 2:41 PM

## 2013-12-09 NOTE — Discharge Instructions (Addendum)
Thank you for trusting us with your medical care!  You were hospitalized for pneumonia and treated with antibiotics. It is important you continue taking the antibiotics through Saturday, 12/12/13.  Please follow-up with Dr. Celine Mansesai on Monday, 12/14/13 @ 9AM.    Pneumonia Pneumonia is an infection of the lungs.  CAUSES Pneumonia may be caused by bacteria or a virus. Usually, these infections are caused by breathing infectious particles into the lungs (respiratory tract). SIGNS AND SYMPTOMS   Cough.  Fever.  Chest pain.  Increased rate of breathing.  Wheezing.  Mucus production. DIAGNOSIS  If you have the common symptoms of pneumonia, your health care provider will typically confirm the diagnosis with a chest X-ray. The X-ray will show an abnormality in the lung (pulmonary infiltrate) if you have pneumonia. Other tests of your blood, urine, or sputum may be done to find the specific cause of your pneumonia. Your health care provider may also do tests (blood gases or pulse oximetry) to see how well your lungs are working. TREATMENT  Some forms of pneumonia may be spread to other people when you cough or sneeze. You may be asked to wear a mask before and during your exam. Pneumonia that is caused by bacteria is treated with antibiotic medicine. Pneumonia that is caused by the influenza virus may be treated with an antiviral medicine. Most other viral infections must run their course. These infections will not respond to antibiotics.  HOME CARE INSTRUCTIONS   Cough suppressants may be used if you are losing too much rest. However, coughing protects you by clearing your lungs. You should avoid using cough suppressants if you can.  Your health care provider may have prescribed medicine if he or she thinks your pneumonia is caused by bacteria or influenza. Finish your medicine even if you start to feel better.  Your health care provider may also prescribe an expectorant. This loosens the mucus  to be coughed up.  Take medicines only as directed by your health care provider.  Do not smoke. Smoking is a common cause of bronchitis and can contribute to pneumonia. If you are a smoker and continue to smoke, your cough may last several weeks after your pneumonia has cleared.  A cold steam vaporizer or humidifier in your room or home may help loosen mucus.  Coughing is often worse at night. Sleeping in a semi-upright position in a recliner or using a couple pillows under your head will help with this.  Get rest as you feel it is needed. Your body will usually let you know when you need to rest. PREVENTION A pneumococcal shot (vaccine) is available to prevent a common bacterial cause of pneumonia. This is usually suggested for:  People over 72 years old.  Patients on chemotherapy.  People with chronic lung problems, such as bronchitis or emphysema.  People with immune system problems. If you are over 65 or have a high risk condition, you may receive the pneumococcal vaccine if you have not received it before. In some countries, a routine influenza vaccine is also recommended. This vaccine can help prevent some cases of pneumonia.You may be offered the influenza vaccine as part of your care. If you smoke, it is time to quit. You may receive instructions on how to stop smoking. Your health care provider can provide medicines and counseling to help you quit. SEEK MEDICAL CARE IF: You have a fever. SEEK IMMEDIATE MEDICAL CARE IF:   Your illness becomes worse. This is especially true if you  are elderly or weakened from any other disease. °· You cannot control your cough with suppressants and are losing sleep. °· You begin coughing up blood. °· You develop pain which is getting worse or is uncontrolled with medicines. °· Any of the symptoms which initially brought you in for treatment are getting worse rather than better. °· You develop shortness of breath or chest pain. °MAKE SURE YOU:   °· Understand these instructions. °· Will watch your condition. °· Will get help right away if you are not doing well or get worse. °Document Released: 01/22/2005 Document Revised: 06/08/2013 Document Reviewed: 04/13/2010 °ExitCare® Patient Information ©2015 [employer]. This information is not intended to replace advice given to you by your health care provider. Make sure you discuss any questions you have with your health care provider. ° °

## 2013-12-10 DIAGNOSIS — I10 Essential (primary) hypertension: Secondary | ICD-10-CM

## 2013-12-10 LAB — BASIC METABOLIC PANEL
ANION GAP: 14 (ref 5–15)
BUN: 12 mg/dL (ref 6–23)
CO2: 22 mEq/L (ref 19–32)
CREATININE: 0.55 mg/dL (ref 0.50–1.10)
Calcium: 9.1 mg/dL (ref 8.4–10.5)
Chloride: 104 mEq/L (ref 96–112)
GFR calc Af Amer: >90 mL/min (ref >90)
GFR calc non Af Amer: >90 mL/min (ref >90)
Glucose, Bld: 91 mg/dL (ref 70–99)
Potassium: 4.1 mEq/L (ref 3.7–5.3)
Sodium: 140 mEq/L (ref 137–147)

## 2013-12-10 MED ORDER — AZITHROMYCIN 500 MG PO TABS: 500.0000 mg | ORAL_TABLET | Freq: Every day | ORAL | Status: DC

## 2013-12-10 MED ORDER — AMOXICILLIN-POT CLAVULANATE 875-125 MG PO TABS: 1.0000 | ORAL_TABLET | Freq: Two times a day (BID) | ORAL | Status: DC

## 2013-12-10 MED ORDER — AMOXICILLIN-POT CLAVULANATE 875-125 MG PO TABS
1.0000 | ORAL_TABLET | Freq: Two times a day (BID) | ORAL | Status: AC
Start: 2013-12-10 — End: 2013-12-12

## 2013-12-10 MED ORDER — AZITHROMYCIN 500 MG PO TABS: 500.0000 mg | ORAL_TABLET | Freq: Every day | ORAL | Status: AC

## 2013-12-10 NOTE — Progress Notes (Signed)
Clinical Social Worker spoke with pt's sister, Drenda FreezeFran with updates for placement. CSW contacted admissions coordinator, [facility]ella [facility]Robin [facility] who reported pt could not be admitted until after 3PM. Pt now has a bed at [facility]. CSW notified pt's sister of bed offer and was advised to complete admissions paperwork at [facility] as soon as possible. Pt's sister pleased and agreeable [place] as initially requested yesterday.   CSW continues to facility discharge needs for today.   Derenda FennelBashira Erendida Wrenn, MSW, LCSWA [phone] 12/10/2013 10:43 AM

## 2013-12-10 NOTE — Progress Notes (Signed)
Subjective: Yesterday, the [facility] facility had issues with accepting her insurance. As such, she was arranged [place].   This AM, she wonders when is leaving the hospital. She has finished her breakfast and is good spirits.  Objective: Vital signs in last 24 hours: Filed Vitals:   12/09/13 1813 12/09/13 2106 12/10/13 0115 12/10/13 0549  BP: 138/53 144/59 161/77 150/70  Pulse: 77 71 74 83  Temp: 98.2 F (36.8 C) 98 F (36.7 C) 97.5 F (36.4 C) 97.9 F (36.6 C)  TempSrc: Axillary Axillary Oral Oral  Resp: 18 20 20 18   Weight:   110 lb 12.8 oz (50.259 kg)   SpO2: 96% 98% 96% 97%   Weight change:   Intake/Output Summary (Last 24 hours) at 12/10/13 0732 Last data filed at 12/09/13 1813  Gross per 24 hour  Intake    480 ml  Output      0 ml  Net    480 ml   Physical Exam  General: alert and awake, sitting up in bed with breakfast tray HEENT: R eye with corneal opacity, L PERRL, EOMI, no scleral icterus  Cardiac: RRR, no rubs, murmurs or gallops  Pulm: clear to auscultation bilaterally, no wheezes, rales, or rhonchi  Abd: soft, nontender, nondistended, BS present  Ext: warm and well perfused, R > L leg though stable Neuro: moves all extremities freely, responds to questions appropriately though did not assess orientation today  Lab Results: Basic Metabolic Panel:  Recent Labs Lab 12/07/13 1320  12/09/13 0520 12/10/13 0534  NA  --   < > 131* 140  K  --   < > 4.5 4.1  CL  --   < > 95* 104  CO2  --   < > 22 22  GLUCOSE  --   < > 99 91  BUN  --   < > 17 12  CREATININE  --   < > 0.73 0.55  CALCIUM  --   < > 9.1 9.1  MG 1.8  --   --   --   < > = values in this interval not displayed. CBC:  Recent Labs Lab 12/04/13 1512  12/06/13 0700 12/07/13 0638  WBC 11.1*  < > 9.2 9.4  NEUTROABS 9.0*  --   --   --   HGB 14.0  < > 12.9 13.8  HCT 39.7  < > 35.9* 38.6  MCV 90.8  < > 90.0 90.6  PLT 204  < > 195 238  < > = values in  this interval not displayed.  Cardiac Enzymes:  Recent Labs Lab 12/04/13 2010 12/05/13 0508 12/05/13 1153  CKTOTAL 142  --   --   TROPONINI <0.30 <0.30 <0.30   Urinalysis:  Recent Labs Lab 12/04/13 1525 12/07/13 0040  COLORURINE YELLOW YELLOW  LABSPEC 1.018 1.004*  PHURINE 6.5 7.0  GLUCOSEU NEGATIVE NEGATIVE  HGBUR MODERATE* MODERATE*  BILIRUBINUR NEGATIVE NEGATIVE  KETONESUR 15* NEGATIVE  PROTEINUR NEGATIVE NEGATIVE  UROBILINOGEN 1.0 1.0  NITRITE NEGATIVE NEGATIVE  LEUKOCYTESUR NEGATIVE NEGATIVE   Studies/Results: No results found. Medications: I have reviewed the patient's current medications. Scheduled Meds: . ALPRAZolam  0.5 mg Oral TID  . amLODipine  10 mg Oral Daily  . amoxicillin-clavulanate  1 tablet Oral Q12H  . azithromycin  500 mg Oral Daily  . Chlorhexidine Gluconate Cloth  6 each Topical Q0600  . divalproex  500 mg Oral Daily  . hydroxychloroquine  400 mg  Oral Daily  . levothyroxine  25 mcg Oral QAC breakfast  . metoprolol  50 mg Oral Daily  . mirtazapine  15 mg Oral QHS  . mupirocin ointment  1 application Nasal BID  . nicotine  7 mg Transdermal Daily  . pantoprazole  40 mg Oral Daily  . senna-docusate  2 tablet Oral BID  . sertraline  50 mg Oral Daily   Continuous Infusions:   PRN Meds:.acetaminophen **OR** acetaminophen, diphenoxylate-atropine, hydrALAZINE, LORazepam, oxyCODONE Assessment/Plan: Principal Problem:   Acute encephalopathy Active Problems:   Hypothyroidism   Hyponatremia   CAP (community acquired pneumonia)   Seizure disorder  Ms. Youlanda RoysYoung Evans is a 73 year old female with seizure disorder, OCD, hypothyroidism hospitalized for acute encephalopathy likely 2/2 CAP awaiting SNF placement.  Acute encephalopathy: Likely 2/2 lingular PNA seen on CXR 10/31. Na 140 this AM.  -Continue azithromycin & Augmentin (Day 5/7) -Continue reduced dose Xanax 0.5mg  TID  Hematuria: Stable from prior UA though less RBCs seen on repeat.    -Consider workup as outpatient  Hypothyroidism: TSH normal. Continue home Synthroid.  OCD: Continue home psych meds.  Seizure disorder: Continue home Depakote and Ativan prn for seizures.  HTN: Mostly 140-160/60-70. Continue home meds.  #FEN:  -Diet: Dysphagia 3 diet (SLP following, appreciate recs)  Dispo: Today  The patient does have a current PCP Aggie Cosier(Balaji Desai, MD) and does not need [facility] follow-up appointment after discharge.  The patient does have transportation limitations that hinder transportation to clinic appointments.  .Services Needed at time of discharge: Y = Yes, Blank = No PT: SNF  OT:   RN:   Equipment:   Other:     LOS: 6 days   Heywood Ilesushil Patel, MD 12/10/2013, 7:32 AM

## 2013-12-10 NOTE — Progress Notes (Signed)
Pt discharged at this time noted on stretcher via [facility] transport alert, verbal with sister at side. No noted distress. Pt leaving for [facility]. Report given to nurse at facility Elsie LincolnMary Porter, RN at 11:58 am. Discharge paperwork along with personal belongings given to pt's sister.

## 2013-12-10 NOTE — Progress Notes (Signed)
Patient ID: Amber Pierce, female   DOB: Apr 23, 1940, 73 y.o.   MRN: [id number] Medicine attending discharge note: I personally interviewed and examined this patient on the day of discharge and I attests to the accuracy of the evaluation and discharge plan as recorded by resident physician Dr. Heywood Ilesushil Patel.  Clinical summary: 73 year old woman admitted on October 30 for change in mental status. She has baseline mild dementia. She has a prior history of a seizure disorder. There was no obvious seizure activity prior to admission.CT scan of the brain showed no acute abnormalities.  She has known hypothyroidism. TSH was 2.25. She has mild chronic hyponatremia. Sodium on admission 128. Sodium at time of discharge 140. Normal renal function. mild, chronic, anemia with hemoglobin 12.6 after hydration. She had low-grade fevers, leukocytosis with initial white count 11,000, 81% neutrophils, and an early left pulmonary  Infiltrate.  She was treated with parenteral antibiotics initially with azithromycin and Rocephin. and her mental status cleared rapidly. She remained stable throughout the hospitalization. No other acute problems were identified. She'll be discharged in stable condition to an [facility] She was previously living with her sister.

## 2013-12-10 NOTE — Progress Notes (Signed)
Patient for placement at [facility]. Pt's sister to complete admission paperwork.   Clinical Social Worker facilitated patient discharge including contacting patient family and facility to confirm patient discharge plans.  Clinical information faxed to facility and family agreeable with plan.  CSW arranged ambulance transport via [facility] for 4PM [facility].  RN to call report prior to discharge.  Clinical Social Worker will sign off for now as social work intervention is no longer needed. Please consult us again if new need arises.   Amber Pierce, MSW, LCSWA [phone] 12/10/2013 2:52 PM

## 2013-12-11 ENCOUNTER — Other Ambulatory Visit: Payer: Self-pay | Admitting: *Deleted

## 2013-12-11 LAB — CULTURE, BLOOD (ROUTINE X 2)
CULTURE: NO GROWTH
Culture: NO GROWTH

## 2013-12-11 MED ORDER — ALPRAZOLAM 0.5 MG PO TABS: ORAL_TABLET | ORAL | Status: AC

## 2013-12-11 MED ORDER — OXYCODONE HCL 5 MG PO TABS: ORAL_TABLET | ORAL | Status: AC

## 2013-12-11 MED ORDER — DIPHENOXYLATE-ATROPINE 2.5-0.025 MG PO TABS: ORAL_TABLET | ORAL | Status: AC

## 2013-12-11 NOTE — Telephone Encounter (Signed)
[facility] 

## 2013-12-11 NOTE — Telephone Encounter (Signed)
[facility] 

## 2013-12-14 ENCOUNTER — Encounter: Payer: Self-pay | Admitting: Internal Medicine

## 2013-12-14 ENCOUNTER — Non-Acute Institutional Stay (SKILLED_NURSING_FACILITY): Payer: Medicare Other | Admitting: Internal Medicine

## 2013-12-14 DIAGNOSIS — G9340 Encephalopathy, unspecified: Secondary | ICD-10-CM

## 2013-12-14 DIAGNOSIS — Z91148 Patient's other noncompliance with medication regimen for other reason: Secondary | ICD-10-CM

## 2013-12-14 DIAGNOSIS — F429 Obsessive-compulsive disorder, unspecified: Secondary | ICD-10-CM

## 2013-12-14 DIAGNOSIS — F42 Obsessive-compulsive disorder: Secondary | ICD-10-CM

## 2013-12-14 DIAGNOSIS — E038 Other specified hypothyroidism: Secondary | ICD-10-CM

## 2013-12-14 DIAGNOSIS — I10 Essential (primary) hypertension: Secondary | ICD-10-CM

## 2013-12-14 DIAGNOSIS — G40909 Epilepsy, unspecified, not intractable, without status epilepticus: Secondary | ICD-10-CM

## 2013-12-14 DIAGNOSIS — J189 Pneumonia, unspecified organism: Secondary | ICD-10-CM

## 2013-12-14 DIAGNOSIS — E034 Atrophy of thyroid (acquired): Secondary | ICD-10-CM

## 2013-12-14 DIAGNOSIS — E871 Hypo-osmolality and hyponatremia: Secondary | ICD-10-CM

## 2013-12-14 DIAGNOSIS — Z9114 Patient's other noncompliance with medication regimen: Secondary | ICD-10-CM

## 2013-12-14 NOTE — Assessment & Plan Note (Signed)
Reported, mild chronic , Na 128 on admission to hospital, and 140 on d/c after IVF;rec that pt's NA be checked daily. This seems quite unrealistic. Na tablets maybe? She doesn't have fluid problems

## 2013-12-14 NOTE — Progress Notes (Signed)
MRN: [id number] Name: Amber Pierce  Sex: female Age: 73 y.o. DOB: 1940/11/08  [facility] Facility/Room: 307 Level Of Care: SNF Provider: Merrilee SeashoreALEXANDER, ANNE D Emergency Contacts: Extended Emergency Contact Information Primary Emergency Contact: Flemming,Fran Address: [address]          [place], [place] [id number] [place] Home Phone: [phone] Relation: Sister Secondary Emergency Contact: Raiford NobleBURKS,KEITH [address]: [address]          ,  [id number] Home Phone: [phone] Relation: None    Allergies: Iron cr  Chief Complaint  Patient presents with  . New Admit To SNF    HPI: Patient is 73 y.o. female who has had encephalopathy several times, this time 2/2 PNA and dehydration, admitted to SNF for generalized weakness.  Past Medical History  Diagnosis Date  . Hypertension   . Thyroid disease   . Seizures   . Arthritis   . GERD (gastroesophageal reflux disease)   . Anxiety     History reviewed. No pertinent past surgical history.    Medication List       This list is accurate as of: 12/14/13  7:38 PM.  Always use your most recent med list.               ALPRAZolam 0.5 MG tablet  Commonly known as:  XANAX  Take one tablet by mouth three times daily for anxiety     amLODipine 10 MG tablet  Commonly known as:  NORVASC  Take 1 tablet (10 mg total) by mouth daily.     amoxicillin-clavulanate 875-125 MG per tablet  Commonly known as:  AUGMENTIN  Take 1 tablet by mouth 2 (two) times daily.     azithromycin 500 MG tablet  Commonly known as:  ZITHROMAX  Take by mouth daily.     diphenoxylate-atropine 2.5-0.025 MG per tablet  Commonly known as:  LOMOTIL  Take one tablet by mouth four times daily as needed for diarrhea/loose stools     divalproex 500 MG DR tablet  Commonly known as:  DEPAKOTE  Take 500 mg by mouth daily.     fluvoxaMINE 100 MG tablet  Commonly known as:  LUVOX  Take 1 tablet (100 mg total) by mouth 2 (two) times  daily.     HEMATINIC PLUS VIT/MINERALS PO  Take 1 tablet by mouth daily.     hydroxychloroquine 200 MG tablet  Commonly known as:  PLAQUENIL  Take 400 mg by mouth daily.     levothyroxine 25 MCG tablet  Commonly known as:  SYNTHROID, LEVOTHROID  Take 25 mcg by mouth daily.     metoprolol 50 MG tablet  Commonly known as:  LOPRESSOR  Take 50 mg by mouth daily.     mirtazapine 15 MG tablet  Commonly known as:  REMERON  Take 15 mg by mouth at bedtime.     omeprazole 20 MG capsule  Commonly known as:  PRILOSEC  Take 20 mg by mouth daily.     oxyCODONE 5 MG immediate release tablet  Commonly known as:  Oxy IR/ROXICODONE  Take one tablet by mouth every 8 hours as needed for pain; Take one tablet by mouth every 12 hours as needed for moderate pain     potassium chloride SA 20 MEQ tablet  Commonly known as:  K-DUR,KLOR-CON  Take 40 mEq by mouth daily.     promethazine 12.5 MG tablet  Commonly known as:  PHENERGAN  Take 12.5 mg by mouth every 6 (six)  hours as needed for nausea or vomiting.     senna-docusate 8.6-50 MG per tablet  Commonly known as:  Senokot-S  Take 2 tablets by mouth 2 (two) times daily.     sertraline 50 MG tablet  Commonly known as:  ZOLOFT  Take 50 mg by mouth daily.     vitamin B-12 500 MCG tablet  Commonly known as:  CYANOCOBALAMIN  Take 500 mcg by mouth daily.        Meds ordered this encounter  Medications  . amoxicillin-clavulanate (AUGMENTIN) 875-125 MG per tablet    Sig: Take 1 tablet by mouth 2 (two) times daily.  Marland Kitchen. azithromycin (ZITHROMAX) 500 MG tablet    Sig: Take by mouth daily.    Immunization History  Administered Date(s) Administered  . Influenza Split 03/17/2012  . Influenza,inj,Quad PF,36+ Mos 12/07/2013  . Pneumococcal Polysaccharide-23 12/07/2013    History  Substance Use Topics  . Smoking status: Current Every Day Smoker -- 0.50 packs/day    Types: Cigarettes  . Smokeless tobacco: Not on file  . Alcohol Use: No     Family history is noncontributory    Review of Systems  DATA OBTAINED: from family member, pt's sister GENERAL:  no fevers, fatigue, appetite changes SKIN: No itching, rash EYES: No eye pain, redness, discharge EARS: No earache, tinnitus, change in hearing NOSE: No congestion, drainage or bleeding  MOUTH/THROAT: No mouth or tooth pain, No sore throat RESPIRATORY: No cough, wheezing, SOB CARDIAC: No chest pain, palpitations, lower extremity edema  GI: No abdominal pain, No N/V/D or constipation, No heartburn or reflux  GU: No dysuria, frequency or urgency, or incontinence  MUSCULOSKELETAL: No unrelieved bone/joint pain NEUROLOGIC: No headache, dizziness or focal weakness PSYCHIATRIC: No overt anxiety or sadness, No behavior issue.   Filed Vitals:   12/14/13 1901  BP: 110/68  Pulse: 58  Temp: 97.1 F (36.2 C)  Resp: 20    Physical Exam  GENERAL APPEARANCE: Alert, nonconversant,  No acute distress.  SKIN: No diaphoresis rash HEAD: Normocephalic, atraumatic  EYES: Conjunctiva/lids clear. Pupils round, reactive. EOMs intact.  EARS: External exam WNL, canals clear. Hearing grossly normal.  NOSE: No deformity or discharge.  MOUTH/THROAT: Lips w/o lesions  RESPIRATORY: Breathing is even, unlabored. Lung sounds are clear   CARDIOVASCULAR: Heart RRR no murmurs, rubs or gallops. No peripheral edema.   GASTROINTESTINAL: Abdomen is soft, non-tender, not distended w/ normal bowel sounds. GENITOURINARY: Bladder non tender, not distended  MUSCULOSKELETAL: No abnormal joints or musculature NEUROLOGIC:  Cranial nerves 2-12 grossly intact. Moves all extremities  PSYCHIATRIC: odd affect, or something odd between the sisters, no behavioral issues  Patient Active Problem List   Diagnosis Date Noted  . Noncompliance with medications 12/14/2013  . CAP (community acquired pneumonia) 12/07/2013  . Seizure disorder   . Acute encephalopathy 12/04/2013  . Metabolic encephalopathy  03/16/2012  . Hyponatremia 03/16/2012  . AKI (acute kidney injury) 03/16/2012  . Hypertension 03/16/2012  . OCD (obsessive compulsive disorder) 05/22/2011  . Hypothyroidism 05/22/2011  . Partial seizure 05/22/2011  . Hypokalemia 05/22/2011    CBC    Component Value Date/Time   WBC 9.4 12/07/2013 0638   RBC 4.26 12/07/2013 0638   HGB 13.8 12/07/2013 0638   HCT 38.6 12/07/2013 0638   PLT 238 12/07/2013 0638   MCV 90.6 12/07/2013 0638   LYMPHSABS 1.1 12/04/2013 1512   MONOABS 1.0 12/04/2013 1512   EOSABS 0.0 12/04/2013 1512   BASOSABS 0.0 12/04/2013 1512    CMP  Component Value Date/Time   NA 140 12/10/2013 0534   K 4.1 12/10/2013 0534   CL 104 12/10/2013 0534   CO2 22 12/10/2013 0534   GLUCOSE 91 12/10/2013 0534   BUN 12 12/10/2013 0534   CREATININE 0.55 12/10/2013 0534   CALCIUM 9.1 12/10/2013 0534   PROT 8.4* 12/04/2013 1512   ALBUMIN 4.4 12/04/2013 1512   AST 45* 12/04/2013 1512   ALT 29 12/04/2013 1512   ALKPHOS 80 12/04/2013 1512   BILITOT 0.5 12/04/2013 1512   GFRNONAA >90 12/10/2013 0534   GFRAA >90 12/10/2013 0534    Assessment and Plan  Acute encephalopathy Likely multifactorial etiology (CAP & electrolyte abnormalities). Repeat CXR showed lingular infiltrate. She was started on a 7-day course of azithromycin IV and ceftriaxone IV which was then transitioned to azithromycin PO and Augmentin PO and is to be completed as outpatient. Na on admission was 129, K 2.7 and were corrected slowly with IV fluids before she could tolerate PO intake. She was recommended to continue Dysphagia 3 diet in the setting of a SNF per speech and PT evaluations, respectively. Her hyponatremia improved, so her sodium should be monitored daily such that it does not contribute to further changes in her mental status  Seizure disorder Neurology was consulted, and EEG workup was unremarkable for seizures. She was continued on home Depakote and had no seizures during her hospital  stay.   Hypothyroidism TSH was 2.25 on admission. Stable on home Synthroid.   Hypertension Continue norvasc and lopressor  CAP (community acquired pneumonia) L lingular, tx with IV rocephin, then augmentin, and IV then po zithromax  Hyponatremia Reported, mild chronic , Na 128 on admission to hospital, and 140 on d/c after IVF;rec that pt's NA be checked daily. This seems quite unrealistic. Na tablets maybe? She doesn't have fluid problems  Noncompliance with medications By hx and by sister today, although I don't think that was the message the sister, with whom pt lives, meant to convey; narcotics and xanax;sister relayed that she has seizures when she misses her xanax  OCD (obsessive compulsive disorder) Noted    Margit Hanks, MD

## 2013-12-14 NOTE — Assessment & Plan Note (Signed)
Neurology was consulted, and EEG workup was unremarkable for seizures. She was continued on home Depakote and had no seizures during her hospital stay.

## 2013-12-14 NOTE — Assessment & Plan Note (Addendum)
By hx and by sister today, although I don't think that was the message the sister, with whom pt lives, meant to convey; narcotics and xanax

## 2013-12-14 NOTE — Assessment & Plan Note (Signed)
TSH was 2.25 on admission. Stable on home Synthroid.

## 2013-12-14 NOTE — Assessment & Plan Note (Signed)
L lingular, tx with IV rocephin, then augmentin, and IV then po zithromax

## 2013-12-14 NOTE — Assessment & Plan Note (Signed)
Continue norvasc and lopressor  

## 2013-12-14 NOTE — Assessment & Plan Note (Signed)
Likely multifactorial etiology (CAP & electrolyte abnormalities). Repeat CXR showed lingular infiltrate. She was started on a 7-day course of azithromycin IV and ceftriaxone IV which was then transitioned to azithromycin PO and Augmentin PO and is to be completed as outpatient. Na on admission was 129, K 2.7 and were corrected slowly with IV fluids before she could tolerate PO intake. She was recommended to continue Dysphagia 3 diet in the setting of a SNF per speech and PT evaluations, respectively. Her hyponatremia improved, so her sodium should be monitored daily such that it does not contribute to further changes in her mental status

## 2013-12-14 NOTE — Assessment & Plan Note (Signed)
Noted  

## 2016-06-17 IMAGING — CT CT HEAD W/O CM
2 series · 16 of 30 positions shown, 18 images · non-contrast
Comparison: Head CT without contrast 03/16/2012.

CLINICAL DATA: 73-year-old female, found down, unresponsive.
Possible seizure this morning. Initial encounter.

EXAM:
CT HEAD WITHOUT CONTRAST
TECHNIQUE: Contiguous axial images were obtained from the base of the skull
through the vertex without intravenous contrast.

[Series 201: head w/o, idose (1) · axial · non-contrast · 0.49mm/px · z∈[+111,+231]mm · 8 of 32 slices shown, 10 images]
[im 4/32  brain]
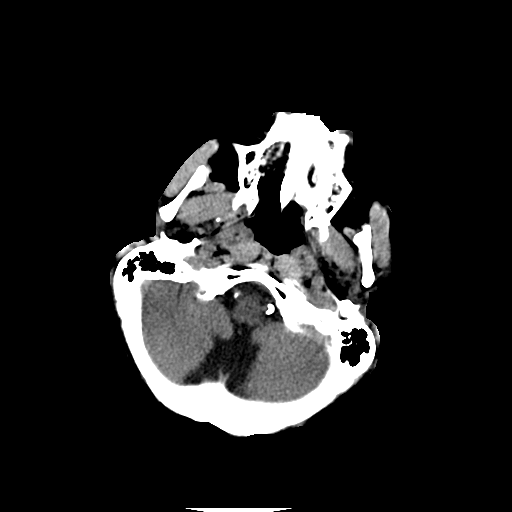
[im 4/32  bone]
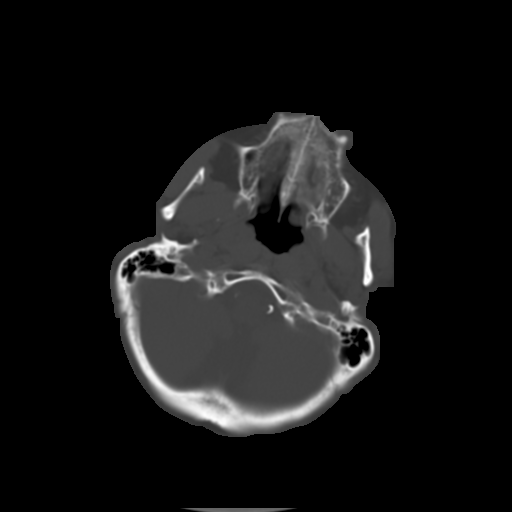
[im 7/32  brain]
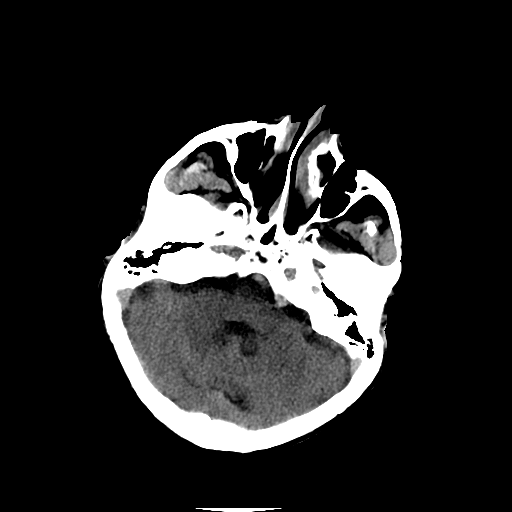
[im 11/32  brain]
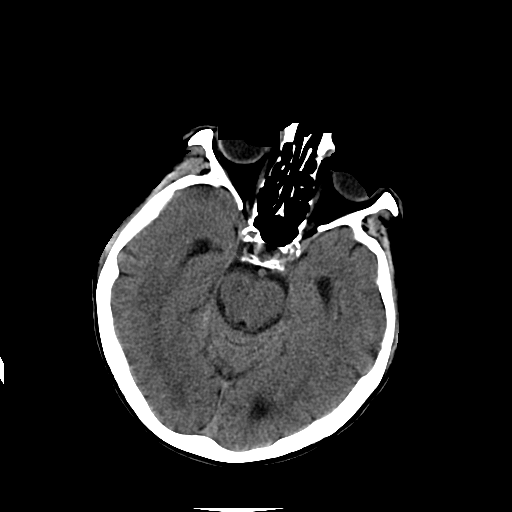
[im 14/32  brain]
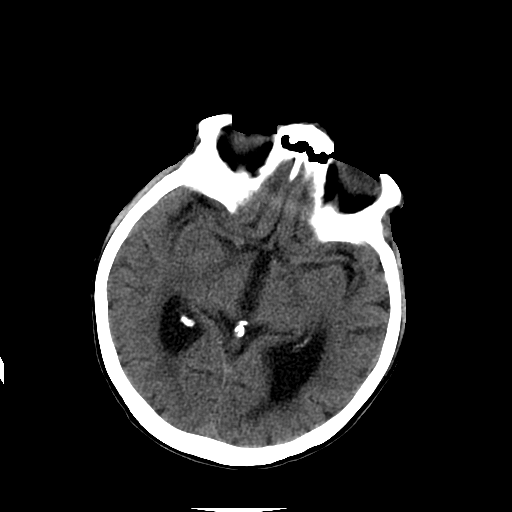
[im 18/32  brain]
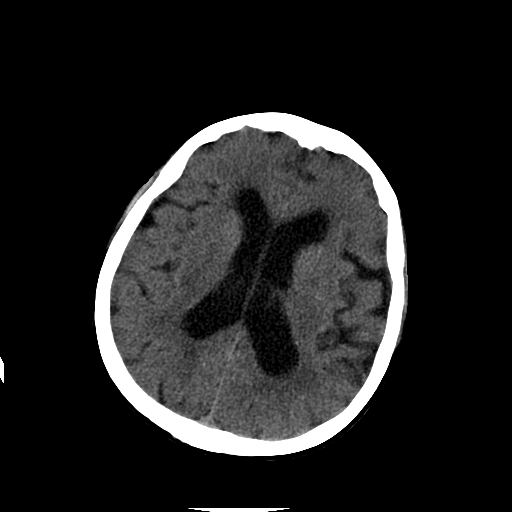
[im 18/32  bone]
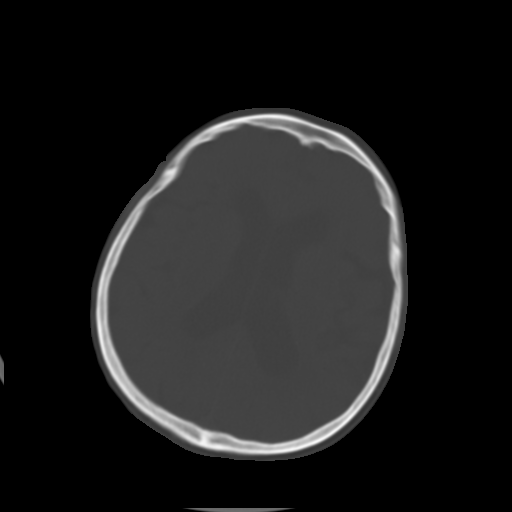
[im 21/32  brain]
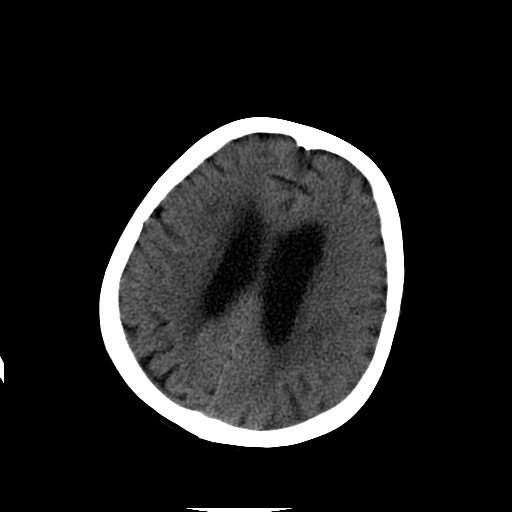
[im 25/32  brain]
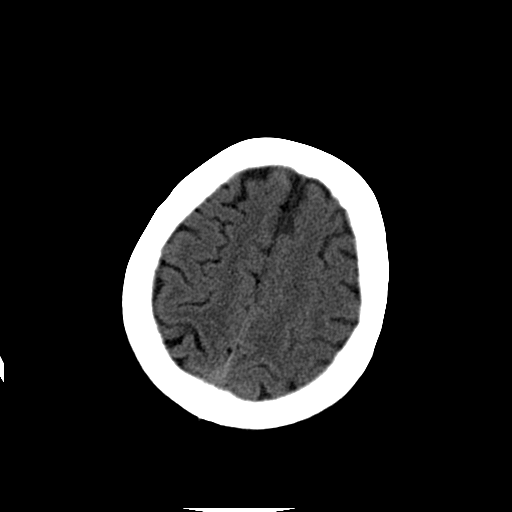
[im 28/32  brain]
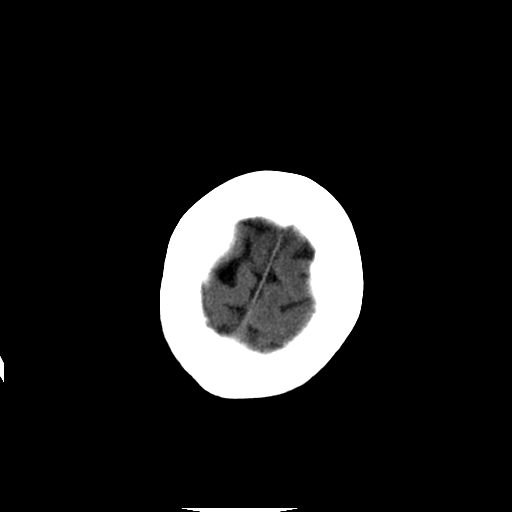

[Series 202: head w/o bone, idose (1) · axial · non-contrast · 0.49mm/px · z∈[+110,+235]mm · 8 of 64 slices shown]
[im 7/64  bone]
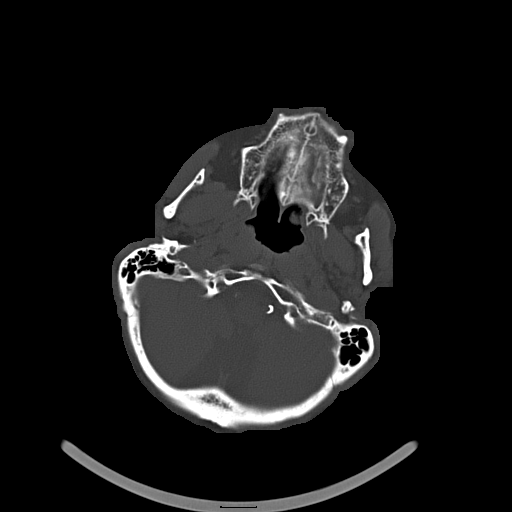
[im 14/64  bone]
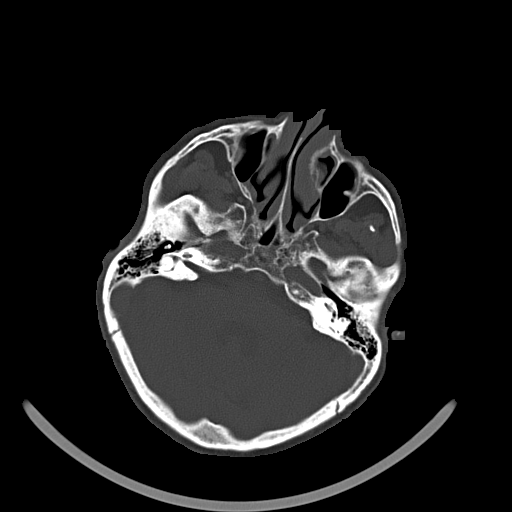
[im 20/64  bone]
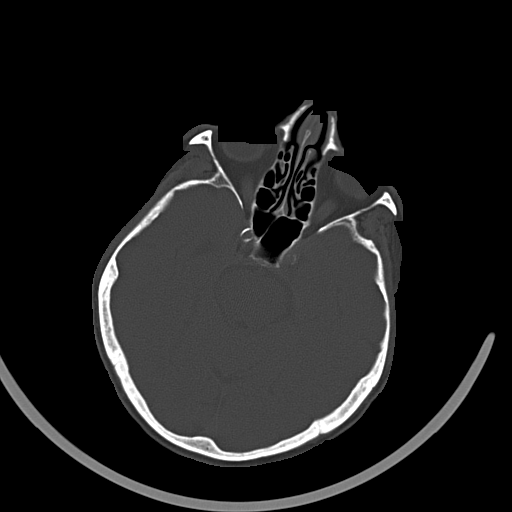
[im 27/64  bone]
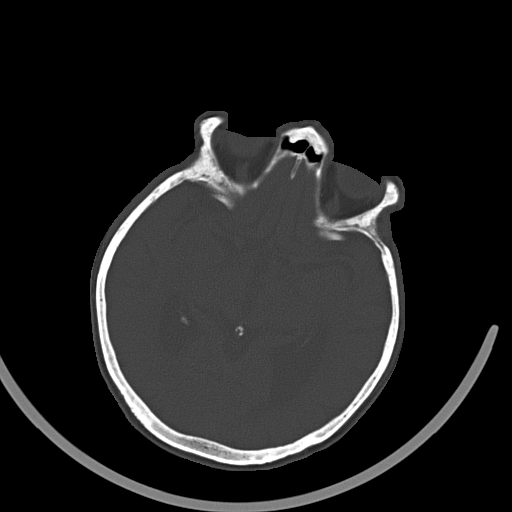
[im 37/64  bone]
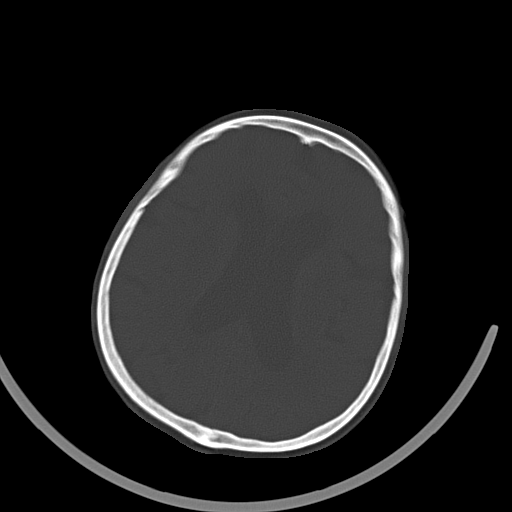
[im 44/64  bone]
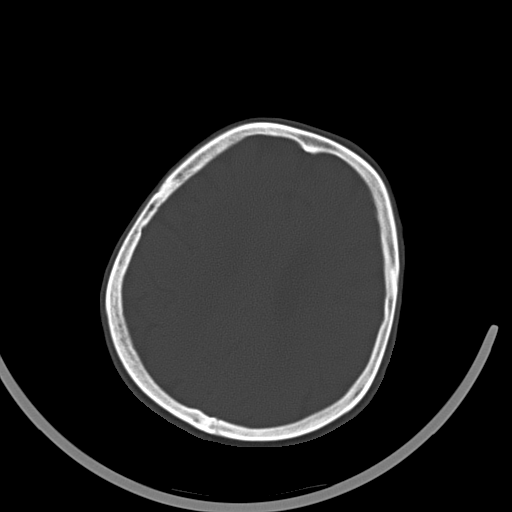
[im 50/64  bone]
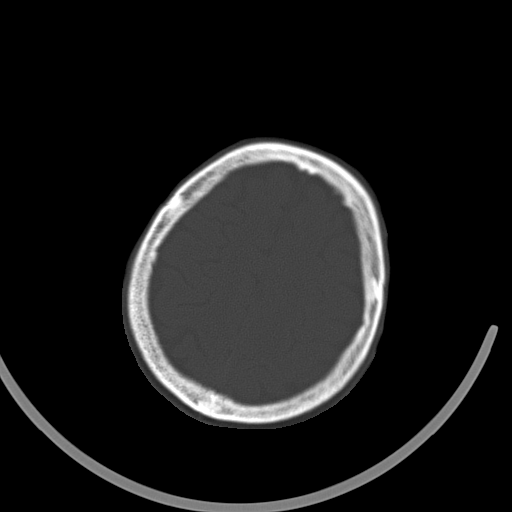
[im 57/64  bone]
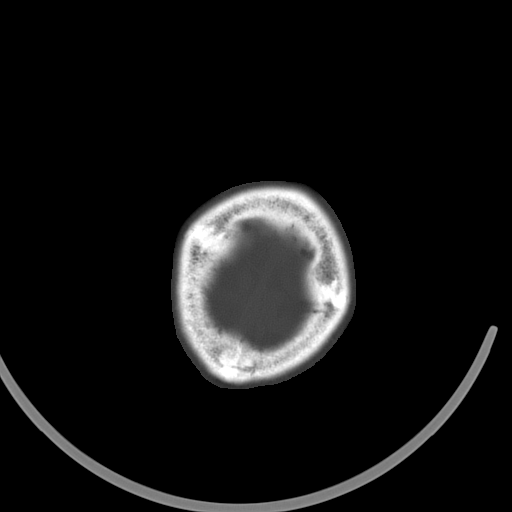

[16 of 30 positions shown; findings below may reference images not displayed]

FINDINGS: Sequelae of upper cervical posterior fusion hardware placement re-
identified. Stable visualized osseous structures. No acute orbit or
scalp soft tissue findings. Visualized paranasal sinuses and
mastoids are clear.

Calcified atherosclerosis at the skull base. Stable cerebral volume.
Mild ventricular prominence unchanged and felt to be physiologic for
this patient. Patchy and confluent chronic cerebral white matter
hypodensity is stable. No midline shift, mass effect, or evidence of
intracranial mass lesion. No acute intracranial hemorrhage
identified. No evidence of cortically based acute infarction
identified. No suspicious intracranial vascular hyperdensity.
IMPRESSION: No acute intracranial abnormality. Chronic nonspecific white matter
changes.

## 2018-11-06 DEATH — deceased
